# Patient Record
Sex: Male | Born: 1980 | Race: Black or African American | Hispanic: No | Marital: Single | Smoking: Never smoker
Health system: Southern US, Community
[De-identification: ages and names within clinical notes are randomized; demographics above are authoritative.]

## PROBLEM LIST (undated history)

## (undated) DIAGNOSIS — I10 Essential (primary) hypertension: Secondary | ICD-10-CM

## (undated) HISTORY — PX: KNEE SURGERY: SHX244

---

## 2009-09-19 DIAGNOSIS — B009 Herpesviral infection, unspecified: Secondary | ICD-10-CM | POA: Insufficient documentation

## 2009-09-19 DIAGNOSIS — B2790 Infectious mononucleosis, unspecified without complication: Secondary | ICD-10-CM | POA: Insufficient documentation

## 2010-01-02 ENCOUNTER — Encounter (INDEPENDENT_AMBULATORY_CARE_PROVIDER_SITE_OTHER): Payer: Self-pay | Admitting: *Deleted

## 2010-01-07 ENCOUNTER — Ambulatory Visit: Payer: Self-pay | Admitting: Infectious Diseases

## 2010-01-07 DIAGNOSIS — K120 Recurrent oral aphthae: Secondary | ICD-10-CM

## 2010-01-07 DIAGNOSIS — I10 Essential (primary) hypertension: Secondary | ICD-10-CM | POA: Insufficient documentation

## 2010-01-07 LAB — CONVERTED CEMR LAB
Basophils Absolute: 0 10*3/uL (ref 0.0–0.1)
Basophils Relative: 0 % (ref 0–1)
Lymphocytes Relative: 40 % (ref 12–46)
MCHC: 32.9 g/dL (ref 30.0–36.0)
Neutro Abs: 3.5 10*3/uL (ref 1.7–7.7)
Neutrophils Relative %: 49 % (ref 43–77)
RBC: 5.12 M/uL (ref 4.22–5.81)
RDW: 13.6 % (ref 11.5–15.5)

## 2010-09-29 NOTE — Miscellaneous (Signed)
Summary: HIPAA Restrictions  HIPAA Restrictions   Imported By: Florinda Marker 01/08/2010 11:33:01  _____________________________________________________________________  External Attachment:    Type:   Image     Comment:   External Document

## 2010-09-29 NOTE — Assessment & Plan Note (Signed)
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  History of Present Illness: 30 yo with obesity and HTN (? new dx) who was seen in [facility] Sep 27 2009 with fagtigue.  He says that at work in Jan he did a cbc on himself and noted that he had elevated lymphs and some atypical lymphs on manual smear he did.   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  He was very upset with this diagnosis His only other thing he remembers were canker sores in his mouth a few weeks ago.  He has no history of other stds or HIV.  He has no history of penile ulcers or rectal ulcers.  Has no abd pain or diarrhea.  He is sexually active with women but did go through a "bisexual phase".  He says he gets himself checked regularly. 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    Preventive Screening-Counseling & Management  Alcohol-Tobacco     Alcohol drinks/day: <1     Smoking Status: never  Caffeine-Diet-Exercise     Caffeine use/day: yes     Does Patient Exercise: yes     Type of exercise: walking, cardio     Exercise (avg: min/session): 1-2 hours     Times/week: 2   Updated Prior Medication List: VOLTAREN-XR 100 MG XR24H-TAB (DICLOFENAC SODIUM) Take 1 tablet by mouth once a day per PCP  Current Allergies (reviewed today): ! AMOXICILLIN ! AUGMENTIN ! * CHERRIES Past History:  Past Medical History: HSV II HTN Mono  Social History: works as a Optometrist at [employer]. Lives alone no tob,  occas alcohol no drugs  Review of Systems       11 systems reviewed and negative except per HPI   Vital Signs:  Patient profile:   30 year old male Height:      73.5 inches (186.69 cm) Weight:      400 pounds (181.82 kg) BMI:     52.25 Temp:     98.4 degrees F (36.89 degrees C) oral Pulse rate:   94 / minute BP sitting:   182 / 147  (left arm) Cuff size:   regular  Vitals Entered By: Jennet Maduro RN (Jan 07, 2010 3:11 PM) CC: previous infection with Mono and HSV II Is Patient Diabetic? No Pain Assessment Patient in pain? no      Nutritional Status BMI of > 30 = obese Nutritional Status Detail appetite "ok"  Have you ever been in a relationship where you felt threatened, hurt or afraid?NOT ASKED   Does patient need assistance? Functional Status Self care Ambulation Normal   Serial Vital Signs/Assessments:  Time      Position  BP       Pulse  Resp  Temp     By 3:14 PM   R Arm     190/114                        Jennet Maduro RN  Comments: 3:14 PM lower arm due to pt's size Jennet Maduro RN  Jan 07, 2010 3:14 PM  By: Jennet Maduro RN    Physical Exam  General:  obese  Head:  normocephalic.   Eyes:  vision grossly intact, pupils equal, pupils round, and pupils reactive to light.   Mouth:  good dentition.  no lesions  Neck:  supple.   Lungs:  normal respiratory effort and normal breath sounds.   Heart:  normal rate, regular rhythm, and no murmur.   Abdomen:  soft, non-tender, and normal bowel sounds.   Genitalia:  uncircumcised.  no lesions Msk:  normal ROM and no joint warmth.   Extremities:  no cce Neurologic:  alert & oriented X3 and cranial nerves II-XII intact.   Skin:  no rashes.   Cervical Nodes:  no anterior cervical adenopathy and no posterior cervical adenopathy.   Axillary Nodes:  no R axillary adenopathy and no L axillary adenopathy.   Inguinal Nodes:  some bil shoddy lan Psych:  Oriented X3 and memory intact for recent and remote.  nervious   Impression & Recommendations:  Problem # 1:  HSV (ICD-054.9)  Serological evidence of HSVII He has multiple questions re how he got this what it means and what to   Orders: Consultation Level IV (16109) T-CBC w/Diff (60454-09811) T-HIV Antibody  (Reflex) ([id number])  Problem # 2:  EPSTEIN-BARR VIRAL MONONUCLEOSIS (ICD-075) Resolved.  Orders: Consultation Level IV (13086) T-CBC  w/Diff ([id number]) T-HIV Antibody  (Reflex) ([id number])  Problem # 3:  ESSENTIAL HYPERTENSION (ICD-401.9)  He will follow up with Dr Renaee Munda at [facility].  He thinks it is so high today due to stress.    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  Reassured him and rec topical therapy if recurs.  Patient Instructions: 1)  Follow up as needed. 2)  Monitor for signs of herpes outbreak and call us if needed.  We can call invaltrex if needed. 3)  If you have further questions or concerns please make a follow up appointment. 4)  Please call for bloodwork results in few days.

## 2010-09-29 NOTE — Miscellaneous (Signed)
Summary: Problems, Medications and Allergies updated  Clinical Lists Changes  Problems: Added new problem of HSV (ICD-054.9) - HSV II positive Added new problem of EPSTEIN-BARR VIRAL MONONUCLEOSIS (ICD-075) - old/resolving Medications: Added new medication of VOLTAREN-XR 100 MG XR24H-TAB (DICLOFENAC SODIUM) Take 1 tablet by mouth once a day per PCP Allergies: Added new allergy or adverse reaction of AMOXICILLIN Observations: Added new observation of ALLERGY REV: Done (01/02/2010 11:12) Added new observation of NKA: F (01/02/2010 11:12)

## 2013-07-10 ENCOUNTER — Ambulatory Visit: Payer: BC Managed Care – HMO | Attending: Orthopedic Surgery | Admitting: Rehabilitation

## 2013-07-10 DIAGNOSIS — M25569 Pain in unspecified knee: Secondary | ICD-10-CM | POA: Insufficient documentation

## 2013-07-10 DIAGNOSIS — IMO0001 Reserved for inherently not codable concepts without codable children: Secondary | ICD-10-CM | POA: Insufficient documentation

## 2013-07-16 ENCOUNTER — Ambulatory Visit: Payer: BC Managed Care – HMO | Admitting: Rehabilitation

## 2013-07-23 ENCOUNTER — Encounter: Payer: BC Managed Care – HMO | Admitting: Rehabilitation

## 2013-07-30 ENCOUNTER — Ambulatory Visit: Payer: BC Managed Care – HMO | Admitting: Rehabilitation

## 2013-08-01 ENCOUNTER — Ambulatory Visit: Payer: BC Managed Care – HMO | Attending: Orthopedic Surgery | Admitting: Rehabilitation

## 2013-08-01 DIAGNOSIS — IMO0001 Reserved for inherently not codable concepts without codable children: Secondary | ICD-10-CM | POA: Insufficient documentation

## 2013-08-01 DIAGNOSIS — M25569 Pain in unspecified knee: Secondary | ICD-10-CM | POA: Insufficient documentation

## 2013-12-23 ENCOUNTER — Emergency Department (HOSPITAL_BASED_OUTPATIENT_CLINIC_OR_DEPARTMENT_OTHER)
Admission: EM | Admit: 2013-12-23 | Discharge: 2013-12-23 | Disposition: A | Discharge status: Home / Self Care | Payer: BC Managed Care – HMO | Attending: Emergency Medicine | Admitting: Emergency Medicine

## 2013-12-23 ENCOUNTER — Encounter (HOSPITAL_BASED_OUTPATIENT_CLINIC_OR_DEPARTMENT_OTHER): Payer: Self-pay | Admitting: Emergency Medicine

## 2013-12-23 ENCOUNTER — Emergency Department (HOSPITAL_BASED_OUTPATIENT_CLINIC_OR_DEPARTMENT_OTHER): Payer: BC Managed Care – HMO

## 2013-12-23 DIAGNOSIS — L02619 Cutaneous abscess of unspecified foot: Secondary | ICD-10-CM | POA: Insufficient documentation

## 2013-12-23 DIAGNOSIS — L03119 Cellulitis of unspecified part of limb: Secondary | ICD-10-CM

## 2013-12-23 DIAGNOSIS — L0390 Cellulitis, unspecified: Secondary | ICD-10-CM

## 2013-12-23 DIAGNOSIS — M19079 Primary osteoarthritis, unspecified ankle and foot: Secondary | ICD-10-CM | POA: Insufficient documentation

## 2013-12-23 DIAGNOSIS — I10 Essential (primary) hypertension: Secondary | ICD-10-CM | POA: Insufficient documentation

## 2013-12-23 DIAGNOSIS — Z79899 Other long term (current) drug therapy: Secondary | ICD-10-CM | POA: Insufficient documentation

## 2013-12-23 DIAGNOSIS — Z880 Allergy status to penicillin: Secondary | ICD-10-CM | POA: Insufficient documentation

## 2013-12-23 DIAGNOSIS — M1990 Unspecified osteoarthritis, unspecified site: Secondary | ICD-10-CM

## 2013-12-23 HISTORY — DX: Essential (primary) hypertension: I10

## 2013-12-23 LAB — CBC WITH DIFFERENTIAL/PLATELET
Basophils Absolute: 0 10*3/uL (ref 0.0–0.1)
Basophils Relative: 0 % (ref 0–1)
EOS ABS: 0 10*3/uL (ref 0.0–0.7)
EOS PCT: 0 % (ref 0–5)
HEMATOCRIT: 38.3 % — AB (ref 39.0–52.0)
HEMOGLOBIN: 12.8 g/dL — AB (ref 13.0–17.0)
LYMPHS ABS: 2.8 10*3/uL (ref 0.7–4.0)
LYMPHS PCT: 29 % (ref 12–46)
MCH: 27.2 pg (ref 26.0–34.0)
MCHC: 33.4 g/dL (ref 30.0–36.0)
MCV: 81.5 fL (ref 78.0–100.0)
MONO ABS: 0.9 10*3/uL (ref 0.1–1.0)
MONOS PCT: 9 % (ref 3–12)
Neutro Abs: 6 10*3/uL (ref 1.7–7.7)
Neutrophils Relative %: 62 % (ref 43–77)
Platelets: 373 10*3/uL (ref 150–400)
RBC: 4.7 MIL/uL (ref 4.22–5.81)
RDW: 14 % (ref 11.5–15.5)
WBC: 9.8 10*3/uL (ref 4.0–10.5)

## 2013-12-23 LAB — URIC ACID: Uric Acid, Serum: 10.6 mg/dL — ABNORMAL HIGH (ref 4.0–7.8)

## 2013-12-23 MED ORDER — NAPROXEN 375 MG PO TABS: 375.0000 mg | ORAL_TABLET | Freq: Two times a day (BID) | ORAL | Status: DC

## 2013-12-23 MED ORDER — CLINDAMYCIN HCL 300 MG PO CAPS: 300.0000 mg | ORAL_CAPSULE | Freq: Four times a day (QID) | ORAL | Status: DC

## 2013-12-23 MED ORDER — OXYCODONE-ACETAMINOPHEN 5-325 MG PO TABS
2.0000 | ORAL_TABLET | Freq: Once | ORAL | Status: AC
  Administered 2013-12-23: 2 via ORAL
  Filled 2013-12-23: qty 2

## 2013-12-23 MED ORDER — KETOROLAC TROMETHAMINE 60 MG/2ML IM SOLN
60.0000 mg | Freq: Once | INTRAMUSCULAR | Status: AC
  Administered 2013-12-23: 60 mg via INTRAMUSCULAR
  Filled 2013-12-23: qty 2

## 2013-12-23 MED ORDER — CLINDAMYCIN HCL 150 MG PO CAPS
300.0000 mg | ORAL_CAPSULE | Freq: Once | ORAL | Status: AC
  Administered 2013-12-23: 300 mg via ORAL
  Filled 2013-12-23: qty 2

## 2013-12-23 MED ORDER — OXYCODONE-ACETAMINOPHEN 5-325 MG PO TABS: 2.0000 | ORAL_TABLET | ORAL | Status: DC | PRN

## 2013-12-23 NOTE — Discharge Instructions (Signed)
Cellulitis Cellulitis is an infection of the skin and the tissue beneath it. The infected area is usually red and tender. Cellulitis occurs most often in the arms and lower legs.  CAUSES  Cellulitis is caused by bacteria that enter the skin through cracks or cuts in the skin. The most common types of bacteria that cause cellulitis are Staphylococcus and Streptococcus. SYMPTOMS   Redness and warmth.  Swelling.  Tenderness or pain.  Fever. DIAGNOSIS  Your caregiver can usually determine what is wrong based on a physical exam. Blood tests may also be done. TREATMENT  Treatment usually involves taking an antibiotic medicine. HOME CARE INSTRUCTIONS   Take your antibiotics as directed. Finish them even if you start to feel better.  Keep the infected arm or leg elevated to reduce swelling.  Apply a warm cloth to the affected area up to 4 times per day to relieve pain.  Only take over-the-counter or prescription medicines for pain, discomfort, or fever as directed by your caregiver.  Keep all follow-up appointments as directed by your caregiver. SEEK MEDICAL CARE IF:   You notice red streaks coming from the infected area.  Your red area gets larger or turns dark in color.  Your bone or joint underneath the infected area becomes painful after the skin has healed.  Your infection returns in the same area or another area.  You notice a swollen bump in the infected area.  You develop new symptoms. SEEK IMMEDIATE MEDICAL CARE IF:   You have a fever.  You feel very sleepy.  You develop vomiting or diarrhea.  You have a general ill feeling (malaise) with muscle aches and pains. MAKE SURE YOU:   Understand these instructions.  Will watch your condition.  Will get help right away if you are not doing well or get worse. Document Released: 05/26/2005 Document Revised: 02/15/2012 Document Reviewed: 11/01/2011 [facility] Patient Information 2014 [place], [place].  Arthritis,  Nonspecific Arthritis is inflammation of a joint. This usually means pain, redness, warmth or swelling are present. One or more joints may be involved. There are a number of types of arthritis. Your caregiver may not be able to tell what type of arthritis you have right away. CAUSES  The most common cause of arthritis is the wear and tear on the joint (osteoarthritis). This causes damage to the cartilage, which can break down over time. The knees, hips, back and neck are most often affected by this type of arthritis. Other types of arthritis and common causes of joint pain include:  Sprains and other injuries near the joint. Sometimes minor sprains and injuries cause pain and swelling that develop hours later.  Rheumatoid arthritis. This affects hands, feet and knees. It usually affects both sides of your body at the same time. It is often associated with chronic ailments, fever, weight loss and general weakness.  Crystal arthritis. Gout and pseudo gout can cause occasional acute severe pain, redness and swelling in the foot, ankle, or knee.  Infectious arthritis. Bacteria can get into a joint through a break in overlying skin. This can cause infection of the joint. Bacteria and viruses can also spread through the blood and affect your joints.  Drug, infectious and allergy reactions. Sometimes joints can become mildly painful and slightly swollen with these types of illnesses. SYMPTOMS   Pain is the main symptom.  Your joint or joints can also be red, swollen and warm or hot to the touch.  You may have a fever with certain types  of arthritis, or even feel overall ill.  The joint with arthritis will hurt with movement. Stiffness is present with some types of arthritis. DIAGNOSIS  Your caregiver will suspect arthritis based on your description of your symptoms and on your exam. Testing may be needed to find the type of arthritis:  Blood and sometimes urine tests.  X-ray tests and sometimes  CT or MRI scans.  Removal of fluid from the joint (arthrocentesis) is done to check for bacteria, crystals or other causes. Your caregiver (or a specialist) will numb the area over the joint with a local anesthetic, and use a needle to remove joint fluid for examination. This procedure is only minimally uncomfortable.  Even with these tests, your caregiver may not be able to tell what kind of arthritis you have. Consultation with a specialist (rheumatologist) may be helpful. TREATMENT  Your caregiver will discuss with you treatment specific to your type of arthritis. If the specific type cannot be determined, then the following general recommendations may apply. Treatment of severe joint pain includes:  Rest.  Elevation.  Anti-inflammatory medication (for example, ibuprofen) may be prescribed. Avoiding activities that cause increased pain.  Only take over-the-counter or prescription medicines for pain and discomfort as recommended by your caregiver.  Cold packs over an inflamed joint may be used for 10 to 15 minutes every hour. Hot packs sometimes feel better, but do not use overnight. Do not use hot packs if you are diabetic without your caregiver's permission.  A cortisone shot into arthritic joints may help reduce pain and swelling.  Any acute arthritis that gets worse over the next 1 to 2 days needs to be looked at to be sure there is no joint infection. Long-term arthritis treatment involves modifying activities and lifestyle to reduce joint stress jarring. This can include weight loss. Also, exercise is needed to nourish the joint cartilage and remove waste. This helps keep the muscles around the joint strong. HOME CARE INSTRUCTIONS   Do not take aspirin to relieve pain if gout is suspected. This elevates uric acid levels.  Only take over-the-counter or prescription medicines for pain, discomfort or fever as directed by your caregiver.  Rest the joint as much as possible.  If  your joint is swollen, keep it elevated.  Use crutches if the painful joint is in your leg.  Drinking plenty of fluids may help for certain types of arthritis.  Follow your caregiver's dietary instructions.  Try low-impact exercise such as:  Swimming.  Water aerobics.  Biking.  Walking.  Morning stiffness is often relieved by a warm shower.  Put your joints through regular range-of-motion. SEEK MEDICAL CARE IF:   You do not feel better in 24 hours or are getting worse.  You have side effects to medications, or are not getting better with treatment. SEEK IMMEDIATE MEDICAL CARE IF:   You have a fever.  You develop severe joint pain, swelling or redness.  Many joints are involved and become painful and swollen.  There is severe back pain and/or leg weakness.  You have loss of bowel or bladder control. Document Released: 09/23/2004 Document Revised: 11/08/2011 Document Reviewed: 10/09/2008 [facility] Patient Information 2014 [place]Care, [place]LL[place].

## 2013-12-23 NOTE — ED Notes (Signed)
Onset on Friday of right foot pain, swelling. No prior history of injury.

## 2013-12-23 NOTE — ED Provider Notes (Signed)
CSN: [id number]     Arrival date & time 12/23/13  1659 History  This chart was scribed for Timothy BuccoMelanie Adarian Bur, MD by Danella Maiersaroline Early, ED Scribe. This patient was seen in room MH07/MH07 and the patient's care was started at 5:20 PM.    Chief Complaint  Patient presents with  . Foot Pain   The history is provided by the patient. No language interpreter was used.   HPI Comments: Geradine GirtCarlos Rhatigan is a 33 y.o. male who presents to the Emergency Department complaining of gradually-worsening right foot pain with associated swelling  onset 2 days ago. No reported fevers at home.  Temp was 99 in the ED. The pain is concentrated in the right great toe and radiates in the foot. He states even the bedsheets hurt to touch. Redness to the area started yesterday. He reports pain with bearing weight on the foot. He states he wore a new pair of shoes 3 days ago. H/o fractures in the same foot in 2009. No h/o gout, but does have a FH of gout.  Has had an elevated uric acid level in the past.  He took leftover Mobic with no relief. He denies injury. He denies numbness or tingling. He is otherwise healthy.   Past Medical History  Diagnosis Date  . Hypertension    Past Surgical History  Procedure Laterality Date  . Knee surgery      right knee athroscopy with release   No family history on file. History  Substance Use Topics  . Smoking status: Never Smoker   . Smokeless tobacco: Not on file  . Alcohol Use: Yes    Review of Systems  Constitutional: Negative for fever, chills, diaphoresis and fatigue.  HENT: Negative for congestion, rhinorrhea and sneezing.   Eyes: Negative.   Respiratory: Negative for cough, chest tightness and shortness of breath.   Cardiovascular: Negative for chest pain and leg swelling.  Gastrointestinal: Negative for nausea, vomiting, abdominal pain, diarrhea and blood in stool.  Genitourinary: Negative for frequency, hematuria, flank pain and difficulty urinating.  Musculoskeletal:  Positive for arthralgias (right foot) and joint swelling (right foot). Negative for back pain.  Skin: Negative for rash.  Neurological: Negative for dizziness, speech difficulty, weakness, numbness and headaches.      Allergies  Amoxicillin; Amoxicillin-pot clavulanate; and Cherry  Home Medications   Prior to Admission medications   Medication Sig Start Date End Date Taking? Authorizing Provider  Diclofenac Sodium CR (VOLTAREN-XR) 100 MG 24 hr tablet Take 100 mg by mouth daily.      Historical Provider, MD   BP 168/108  Pulse 112  Temp(Src) 99.6 F (37.6 C)  Resp 20  Ht 6\' 1"  (1.854 m)  Wt 380 lb (172.367 kg)  BMI 50.15 kg/m2  SpO2 100% Physical Exam  Constitutional: He is oriented to person, place, and time. He appears well-developed and well-nourished.  HENT:  Head: Normocephalic and atraumatic.  Neck: Normal range of motion. Neck supple.  Cardiovascular: Normal rate.   Pulmonary/Chest: Effort normal.  Musculoskeletal: He exhibits edema and tenderness.  Mild swelling at the base of the right big toe.  It extends slightly towards the dorsum of the foot. There is some underlying redness to this area and TTP. NVI.  Neurological: He is alert and oriented to person, place, and time.  Skin: Skin is warm and dry.  Psychiatric: He has a normal mood and affect.    ED Course  Procedures (including critical care time) Medications  ketorolac (TORADOL) injection 60  mg (not administered)  oxyCODONE-acetaminophen (PERCOCET/ROXICET) 5-325 MG per tablet 2 tablet (2 tablets Oral Given 12/23/13 1819)  clindamycin (CLEOCIN) capsule 300 mg (300 mg Oral Given 12/23/13 1820)    DIAGNOSTIC STUDIES: Oxygen Saturation is 100% on RA, normal by my interpretation.    COORDINATION OF CARE: 5:44 PM- Discussed treatment plan with pt which includes foot x-ray and pain medication. Pt agrees to plan.  Results for orders placed during the hospital encounter of 12/23/13  CBC WITH DIFFERENTIAL       Result Value Ref Range   WBC 9.8  4.0 - 10.5 K/uL   RBC 4.70  4.22 - 5.81 MIL/uL   Hemoglobin 12.8 (*) 13.0 - 17.0 g/dL   HCT 16.1 (*) 09.6 - 04.5 %   MCV 81.5  78.0 - 100.0 fL   MCH 27.2  26.0 - 34.0 pg   MCHC 33.4  30.0 - 36.0 g/dL   RDW 40.9  81.1 - 91.4 %   Platelets 373  150 - 400 K/uL   Neutrophils Relative % 62  43 - 77 %   Neutro Abs 6.0  1.7 - 7.7 K/uL   Lymphocytes Relative 29  12 - 46 %   Lymphs Abs 2.8  0.7 - 4.0 K/uL   Monocytes Relative 9  3 - 12 %   Monocytes Absolute 0.9  0.1 - 1.0 K/uL   Eosinophils Relative 0  0 - 5 %   Eosinophils Absolute 0.0  0.0 - 0.7 K/uL   Basophils Relative 0  0 - 1 %   Basophils Absolute 0.0  0.0 - 0.1 K/uL  URIC ACID      Result Value Ref Range   Uric Acid, Serum 10.6 (*) 4.0 - 7.8 mg/dL   Dg Foot Complete Right  12/23/2013   CLINICAL DATA:  Right foot pain and swelling.  Low grade fever.  EXAM: RIGHT FOOT COMPLETE - 3+ VIEW  COMPARISON:  None.  FINDINGS: No acute bony or joint abnormality is identified. No radiopaque foreign body or soft tissue gas collection is seen. Dorsal calcaneal spurring is incidentally noted.  IMPRESSION: No acute abnormality.   Electronically Signed   By: Drusilla Kanner M.D.   On: 12/23/2013 18:47      EKG Interpretation None      MDM   Final diagnoses:  Arthritis  Cellulitis    Patient presents with pain, swelling and redness to the base of the right big toe. There some extension of the redness towards the dorsum of the foot.  There is no induration, fluctuance or wounds. I feel this likely is gout 6, however given her some redness extending into the dorsum of the foot, I will treat him as a cellulitis as well. I have a lower suspicion of septic arthritis. I don't feel any significant joint fluid collections. He was treated with Percocet as well as a shot of Toradol in the ED. He felt like the redness and the pain had subsided somewhat in the ED. He was also given clindamycin. He was discharged with a  prescription for clindamycin, Naprosyn, and Percocet. Advised him that he needs a wound check in 2 days. He feels he can followup with his primary care physician. I advised to return here if he has any worsening symptoms or extension of the redness. He can also have his blood pressure rechecked at his 2 day wound check.  I personally performed the services described in this documentation, which was scribed in my presence.  The  recorded information has been reviewed and considered.   Timothy BuccoMelanie Cornelis Kluver, MD 12/23/13 [phone]

## 2015-09-22 IMAGING — CR DG FOOT COMPLETE 3+V*R*
3 series · 3 of 3 positions shown · non-contrast
Comparison: None.

CLINICAL DATA: Right foot pain and swelling.  Low grade fever.

EXAM:
RIGHT FOOT COMPLETE - 3+ VIEW

[t foot ap right]
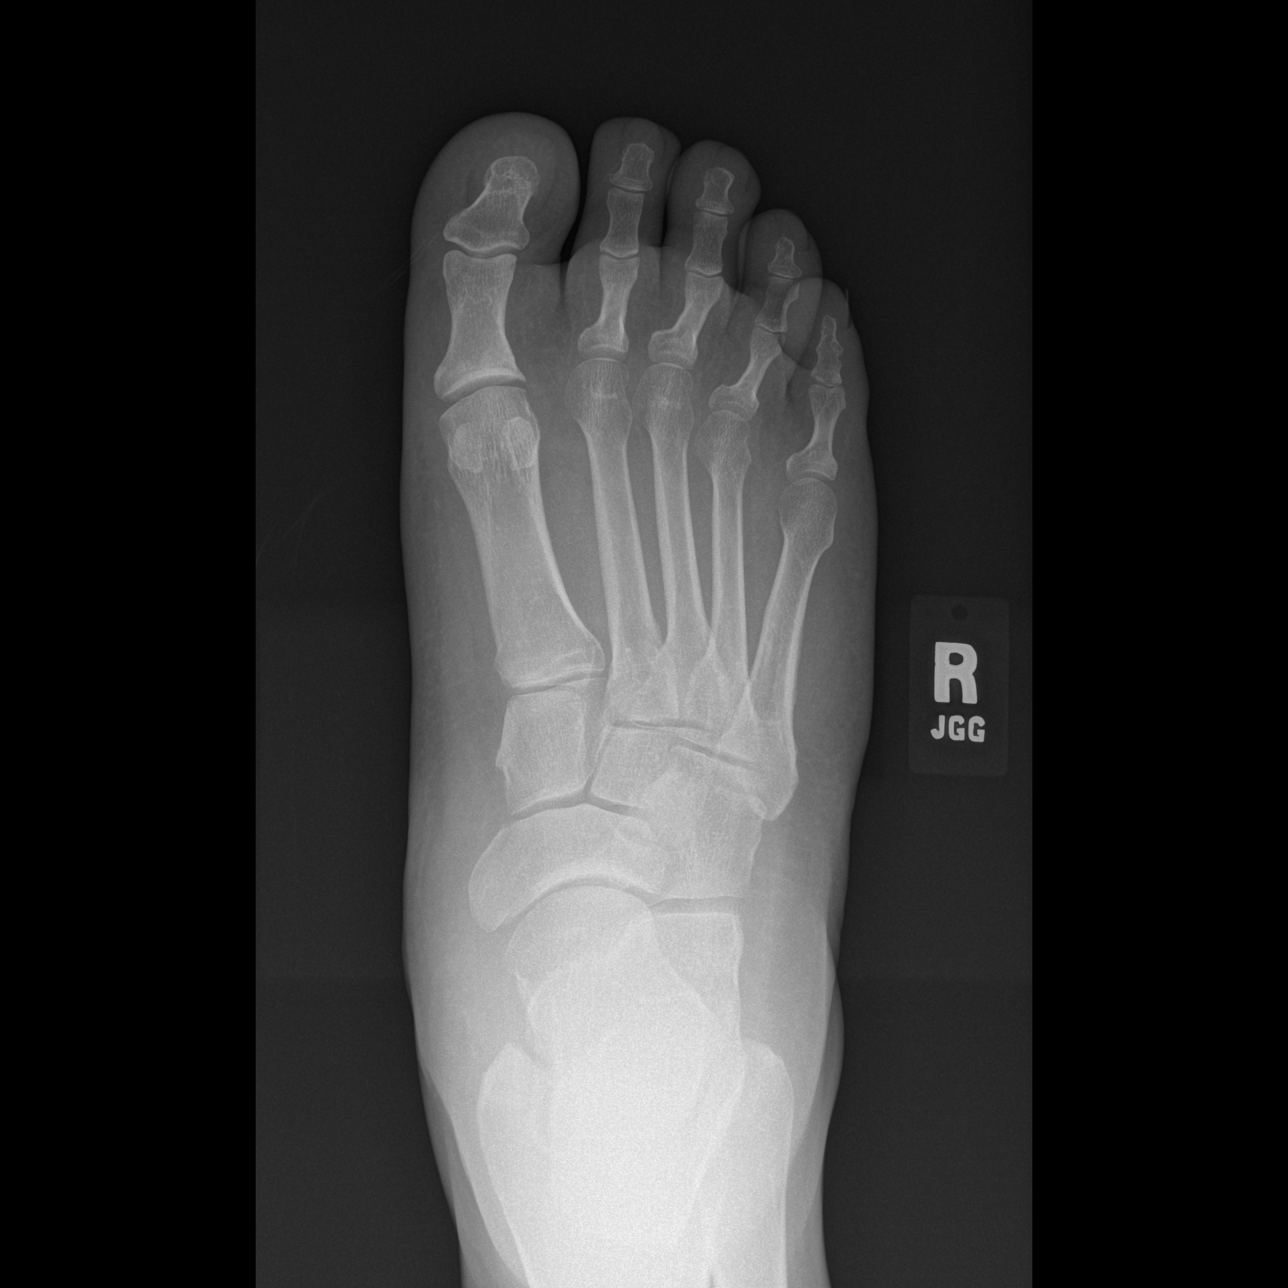

[t foot oblique right]
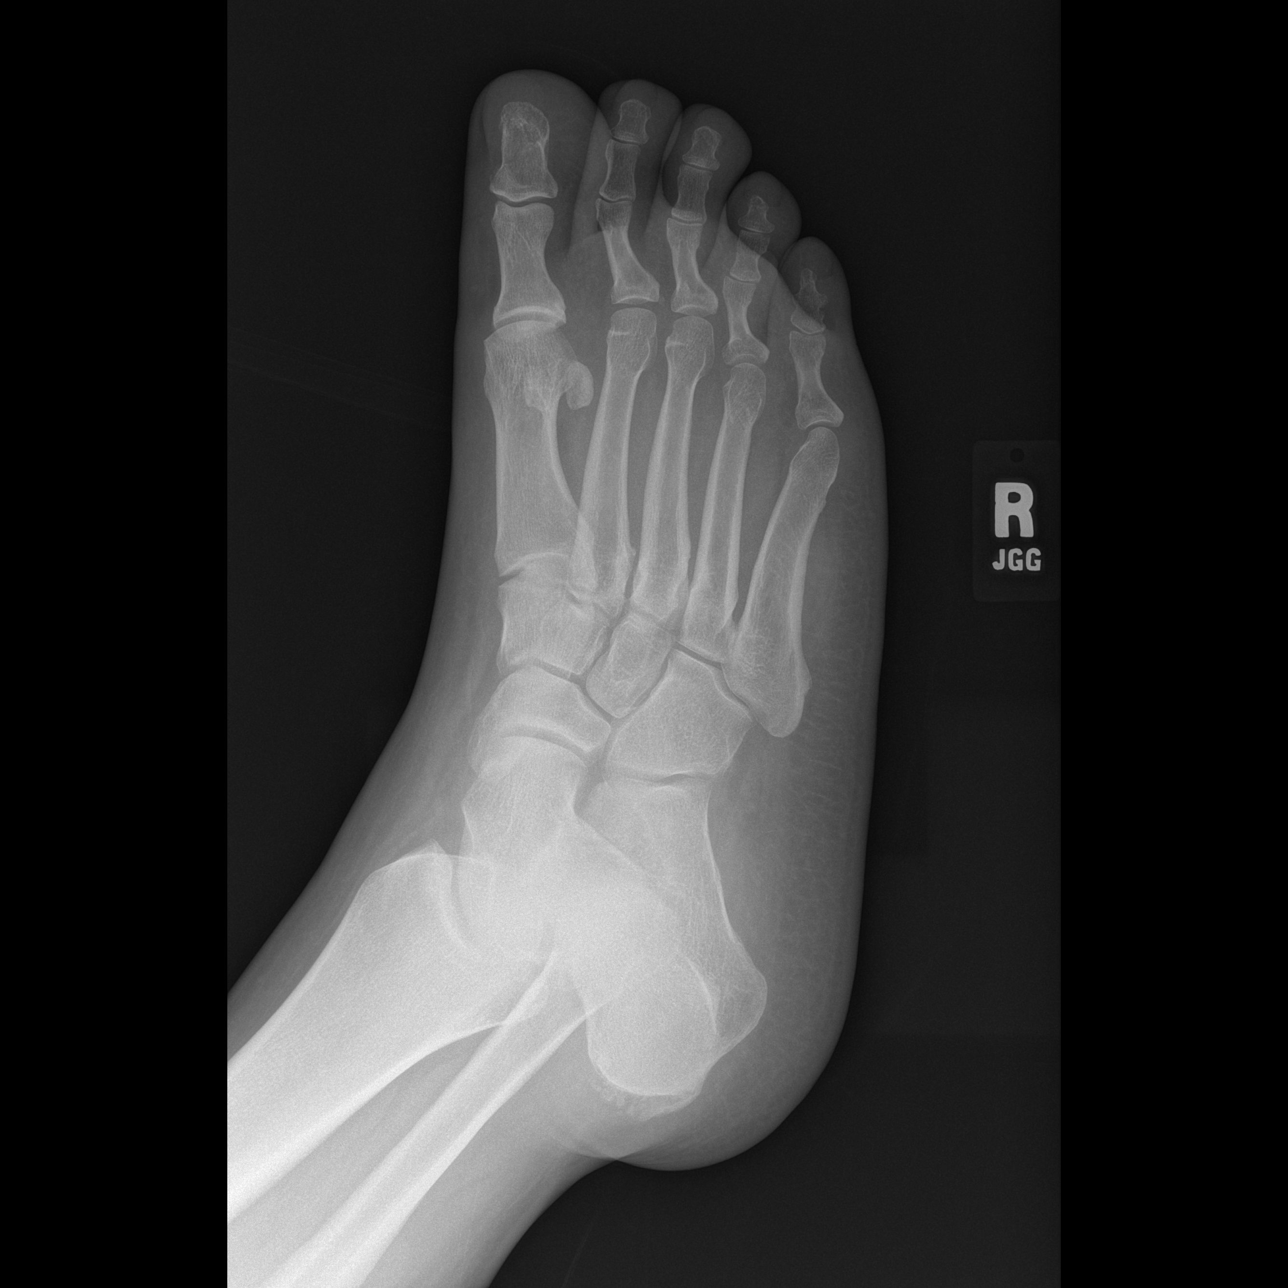

[t foot lat right]
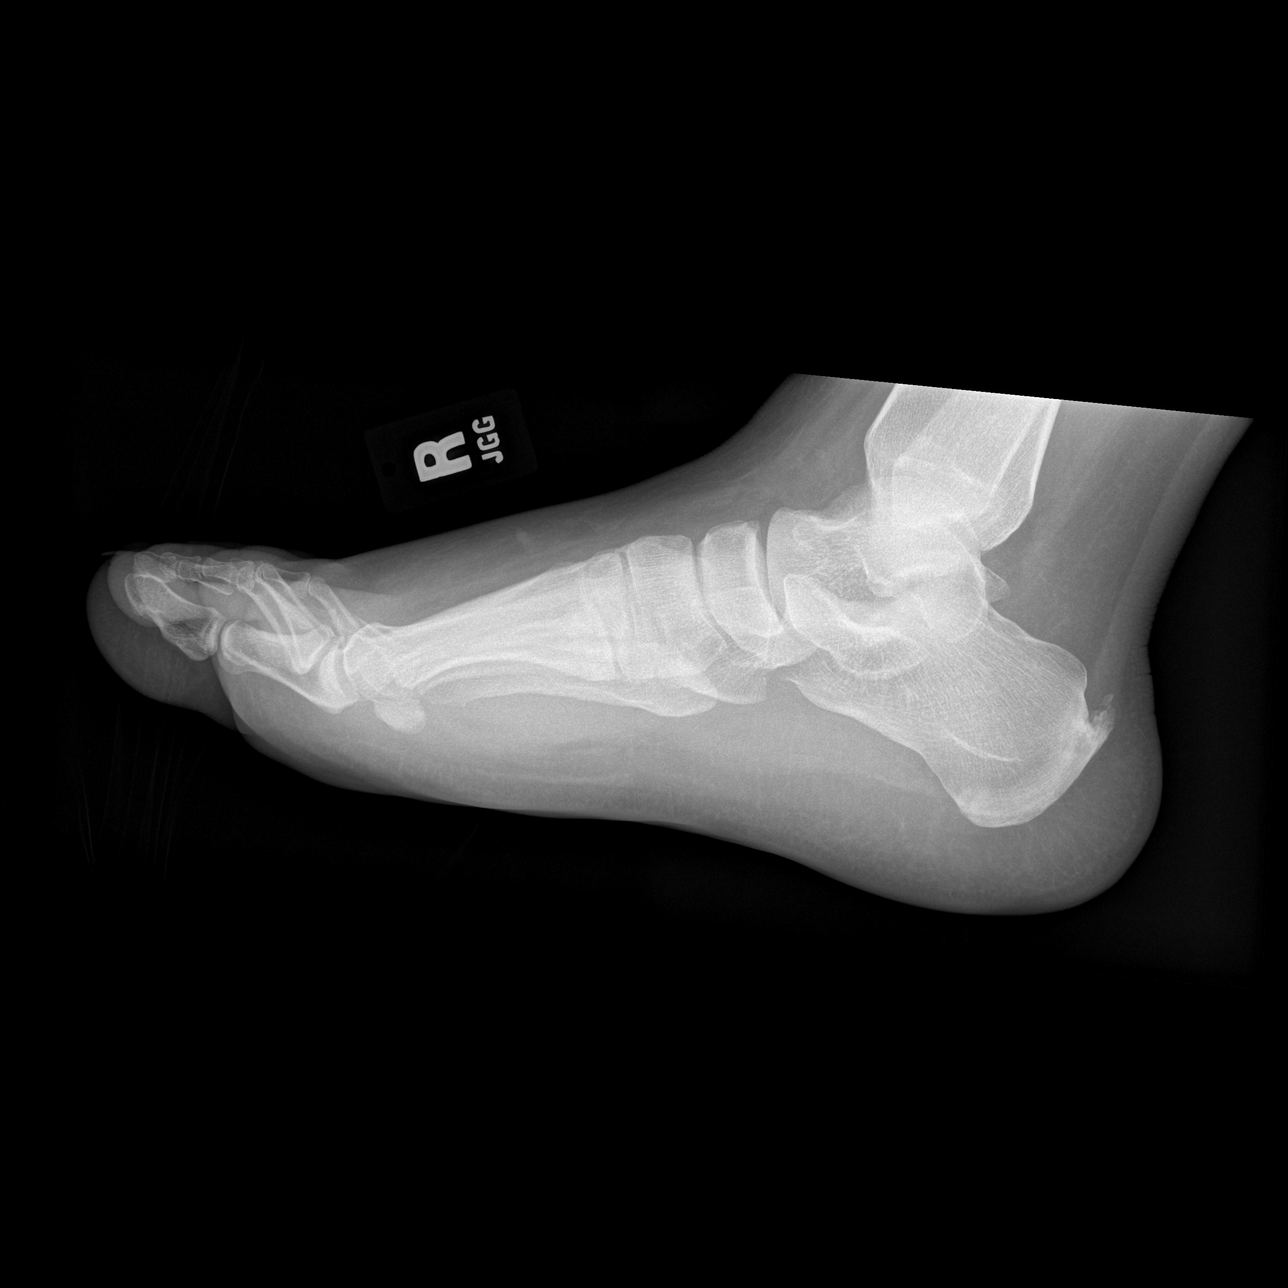

[3 of 3 positions shown; findings below may reference images not displayed]

FINDINGS: No acute bony or joint abnormality is identified. No radiopaque
foreign body or soft tissue gas collection is seen. Dorsal calcaneal
spurring is incidentally noted.
IMPRESSION: No acute abnormality.

## 2021-07-17 ENCOUNTER — Ambulatory Visit: Payer: PRIVATE HEALTH INSURANCE | Attending: Family Medicine

## 2021-07-17 ENCOUNTER — Other Ambulatory Visit: Payer: Self-pay

## 2021-07-17 DIAGNOSIS — M546 Pain in thoracic spine: Secondary | ICD-10-CM | POA: Diagnosis present

## 2021-07-17 DIAGNOSIS — M6281 Muscle weakness (generalized): Secondary | ICD-10-CM | POA: Diagnosis present

## 2021-07-17 DIAGNOSIS — S29012A Strain of muscle and tendon of back wall of thorax, initial encounter: Secondary | ICD-10-CM | POA: Insufficient documentation

## 2021-07-19 NOTE — Therapy (Signed)
[facility] Outpatient Rehabilitation [facility] [address] [place], [place], [address] Phone: [phone]   Fax:  [phone]  Physical Therapy Evaluation  Patient Details  Name: Timothy Bell MRN: [id number] Date of Birth: 20-Sep-1980 Referring Provider (PT): Lanell Persons, MD   Encounter Date: 07/17/2021   PT End of Session - 07/19/21 2103     Visit Number 1    Number of Visits 13    Date for PT Re-Evaluation 09/06/21    Authorization Type [employer]    Progress Note Due on Visit 10    PT Start Time 0720    PT Stop Time 0805    PT Time Calculation (min) 45 min    Activity Tolerance Patient tolerated treatment well    Behavior During Therapy [facility] for tasks assessed/performed             Past Medical History:  Diagnosis Date   Hypertension     Past Surgical History:  Procedure Laterality Date   KNEE SURGERY     right knee athroscopy with release    There were no vitals filed for this visit.    Subjective Assessment - 07/19/21 2251     Subjective Pt reports lifting and reaching out with a container of deionized water, appox the size of a water tank (5 gallons) on Jun 16, 2021, and pulling a muscle on his mid back. He feels like it is getting wrose. Initially he tried ibuprofen, but stopped due to BP concerns. He is now taking tylenol. Currently he is on light duty, completing activities in primarily in sitting.    How long can you sit comfortably? 1 hour    How long can you stand comfortably? 30 mins    How long can you walk comfortably? Not a significant issue    Diagnostic tests None    Patient Stated Goals For pain to resolve and return to his normal duties at work.    Currently in Pain? Yes   5-8 pain range   Pain Score 6     Pain Location Back    Pain Orientation Right;Mid    Pain Descriptors / Indicators Aching    Pain Type Acute pain    Pain Onset 1 to 4 weeks ago    Pain Frequency Constant    Aggravating Factors  Sleeping  on the R, reaching/use of the R arm, driving    Pain Relieving Factors Stretching back or arm, tylenol    Effect of Pain on Daily Activities Manging activity                [facility] PT Assessment - 07/19/21 0001       Assessment   Medical Diagnosis Strain of muscle and tendon of back wall of thorax, initial    Referring Provider (PT) Lanell Persons, MD    Hand Dominance Right    Next MD Visit 2 weeks    Prior Therapy Not issue      Precautions   Precautions None      Restrictions   Weight Bearing Restrictions No      Balance Screen   Has the patient fallen in the past 6 months No      Home Environment   Additional Comments No issues with accessing or mobility within home      Prior Function   Level of Independence Independent    Vocation Full time employment    Vocation Requirements [employer]- Walking, bending, stooping, reaching, lifting  Cognition   Overall Cognitive Status Within Functional Limits for tasks assessed      Observation/Other Assessments   Focus on Therapeutic Outcomes (FOTO)  NA      Sensation   Light Touch Appears Intact      Posture/Postural Control   Posture/Postural Control Postural limitations    Postural Limitations Forward head;Rounded Shoulders;Increased thoracic kyphosis      ROM / Strength   AROM / PROM / Strength AROM;Strength      AROM   Overall AROM Comments Trunk ROM are WNLs c pain provked with forward flexion. R shoulde ROMs are WNLs with min. pain provoked with R UE elevation      Strength   Overall Strength Comments R shoulder demonstrated 4= strengthc flexion, abd and ER due to pain. All other r shoulder movements did not provoke pain      Palpation   Palpation comment TTP to the R paraspinals and interscapular muscles from T4-T10 with the most tender area at T7                        Objective measurements completed on examination: See above findings.     [facility] Adult PT  Treatment/Exercise:  Therapeutic Exercise: - R cross arm c trunk rotation L stretch, 2x 20" - R arm reach through while standing leaning forward on counter, 2x20" - shoulder row, RTB, 10x            PT Education - 07/19/21 2103     Education Details Eval findings, POC, HEP, sleeping positions and support for comfort, heat/cold/biofreeze for pain management    Person(s) Educated Patient    Methods Explanation;Demonstration;Tactile cues;Verbal cues    Comprehension Verbalized understanding;Returned demonstration;Verbal cues required;Tactile cues required              PT Short Term Goals - 07/19/21 2218       PT SHORT TERM GOAL #1   Title Pt will be Ind in an initial HEP    Baseline started on eval    Target Date 08/09/21      PT SHORT TERM GOAL #2   Title Pt will voice understanding of measures to assist in the reduction and management of pain.    Status New    Target Date 08/09/21               PT Long Term Goals - 07/19/21 2241       PT LONG TERM GOAL #1   Title Pt will be Ind in a final HEP to maintain achieved LOF    Status New    Target Date 09/05/21      PT LONG TERM GOAL #2   Title Pt will demonstrate 5/5 strength of the R UE and tolerate lfiting 40Lbs from knee to chest height with 1 lift    Baseline see flowsheet    Target Date 09/05/21      PT LONG TERM GOAL #3   Title Pt will tolerate lifting 1lb 20x from waist level to shoulder height demonstrating appropriate tolerance for repeated lifting    Status New    Target Date 09/05/21      PT LONG TERM GOAL #4   Title Pt will reports improved mid back pain to 0-2 with daily and work related activities    Baseline 5-8/10    Status New    Target Date 09/05/21  Plan - 07/19/21 2201     Clinical Impression Statement Pt presents with signs and symptoms consistent with a mid back stain of the R interscapular muscles and throacic paraspinals. Limitations are  primarily related to reaching with the R UE, resisted R UE activities, ie.lifting, and prolonged postures and activities. PT was initiated for genlte stretches and strengthenig to this area. Pt will benefit from skilled PT 2w6 to address pain and musculoskeletal deficits to optimize functional use of the R UE.    Examination-Activity Limitations Reach Overhead;Carry;Lift;Stand    Examination-Participation Restrictions Occupation    Stability/Clinical Decision Making Stable/Uncomplicated    Clinical Decision Making Low    Rehab Potential Good    PT Frequency 2x / week    PT Duration 6 weeks    PT Treatment/Interventions ADLs/Self Care Home Management;Cryotherapy;Electrical Stimulation;Iontophoresis 4mg /ml Dexamethasone;Moist Heat;Neuromuscular re-education;Therapeutic exercise;Therapeutic activities;Patient/family education;Manual techniques;Dry needling;Passive range of motion;Taping;Vasopneumatic Device;Joint Manipulations    PT Next Visit Plan Assess response to HEP, progress ther ex as indicated    PT Home Exercise Plan R cross arm c trunk rotation L stretch, R arm reach through while standing leaning forward on counter, shoulder row             Patient will benefit from skilled therapeutic intervention in order to improve the following deficits and impairments:  Decreased activity tolerance, Obesity, Impaired UE functional use, Pain, Decreased strength  Visit Diagnosis: Strain of muscle and tendon of back wall of thorax, initial encounter  Pain in thoracic spine  Muscle weakness (generalized)     Problem List Patient Active Problem List   Diagnosis Date Noted   ESSENTIAL HYPERTENSION 01/07/2010   ORAL APHTHAE 01/07/2010   HSV 09/19/2009   EPSTEIN-BARR VIRAL MONONUCLEOSIS 09/19/2009    09/21/2009, PT 07/19/2021, 10:58 PM  [facility] Outpatient Rehabilitation [facility] [address] [place], [place], [place] Phone: [phone]   Fax:   [phone]  Name: Timothy Bell MRN: Geradine Girt Date of Birth: 1981/02/15

## 2021-07-28 ENCOUNTER — Emergency Department
Admission: RE | Admit: 2021-07-28 | Discharge: 2021-07-28 | Disposition: A | Discharge status: Home / Self Care | Payer: 59 | Source: Ambulatory Visit

## 2021-07-28 ENCOUNTER — Other Ambulatory Visit: Payer: Self-pay

## 2021-07-28 ENCOUNTER — Ambulatory Visit: Payer: PRIVATE HEALTH INSURANCE

## 2021-07-28 VITALS — BP 92/68 | HR 105 | Temp 98.3°F | Resp 14

## 2021-07-28 DIAGNOSIS — R059 Cough, unspecified: Secondary | ICD-10-CM

## 2021-07-28 MED ORDER — DOXYCYCLINE HYCLATE 100 MG PO CAPS: 100.0000 mg | ORAL_CAPSULE | Freq: Two times a day (BID) | ORAL | 0 refills | Status: AC

## 2021-07-28 MED ORDER — BENZONATATE 200 MG PO CAPS
200.0000 mg | ORAL_CAPSULE | Freq: Three times a day (TID) | ORAL | 0 refills | Status: AC | PRN
Start: 2021-07-28 — End: 2021-08-04

## 2021-07-28 MED ORDER — PREDNISONE 20 MG PO TABS: ORAL_TABLET | ORAL | 0 refills | Status: DC

## 2021-07-28 NOTE — Discharge Instructions (Addendum)
Advised patient to take medication as directed with food to completion.  Advised patient may take Prednisone burst with first dose of Doxycycline for 5 of next 7 days.  Encouraged patient increase daily water intake while taking this medication.  Advised patient may take Tessalon Perles daily or as needed for cough.

## 2021-07-28 NOTE — ED Triage Notes (Signed)
Pt presents with cough that began Thursday that has not improved with OTC medication. Pt states that it feels the same as when he had pneumonia. Neg covid test x2

## 2021-07-28 NOTE — ED Provider Notes (Signed)
[facility]    CSN: [id number] Arrival date & time: 07/28/21  1231      History   Chief Complaint Chief Complaint  Patient presents with   Appt 1PM   Cough    HPI Timothy Bell is a 40 y.o. male.   HPI 40 year old male presents with cough for 5 days which has not improved with OTC medication.  Patient reports that he feels the same as he did when he once had pneumonia.  Patient reports 2 negative home COVID-19 test.  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 Authorizing Provider  benzonatate (TESSALON) 200 MG capsule Take 1 capsule (200 mg total) by mouth 3 (three) times daily as needed for up to 7 days for cough. 07/28/21 08/04/21 Yes Trevor Iha, FNP  doxycycline (VIBRAMYCIN) 100 MG capsule Take 1 capsule (100 mg total) by mouth 2 (two) times daily for 7 days. 07/28/21 08/04/21 Yes Trevor Iha, FNP  predniSONE (DELTASONE) 20 MG tablet Take 3 tabs PO daily x 5 days. 07/28/21  Yes Trevor Iha, FNP  allopurinol (ZYLOPRIM) 100 MG tablet Take 100 mg by mouth daily.    [provider]  chlorthalidone (HYGROTON) 25 MG tablet Take 25 mg by mouth daily.    [provider]  clindamycin (CLEOCIN) 300 MG capsule Take 1 capsule (300 mg total) by mouth 4 (four) times daily. X 7 days Patient not taking: Reported on 07/19/2021 12/23/13   Rolan Bucco, MD  Diclofenac Sodium CR 100 MG 24 hr tablet Take 100 mg by mouth daily.   Patient not taking: Reported on 07/19/2021    [provider]  naproxen (NAPROSYN) 375 MG tablet Take 1 tablet  (375 mg total) by mouth 2 (two) times daily. Patient not taking: Reported on 07/19/2021 12/23/13   Rolan Bucco, MD  olmesartan (BENICAR) 40 MG tablet Take 40 mg by mouth daily.    [provider]  oxyCODONE-acetaminophen (PERCOCET) 5-325 MG per tablet Take 2 tablets by mouth every 4 (four) hours as needed. Patient not taking: Reported on 07/19/2021 12/23/13   Rolan Bucco, MD    Family History History reviewed. No pertinent family history.  Social History Social History   Tobacco Use   Smoking status: Never  Substance Use Topics   Alcohol use: Yes   Drug use: No     Allergies   Amoxicillin, Amoxicillin-pot clavulanate, and Cherry   Review of Systems Review of Systems  Respiratory:  Positive for cough.   All other systems reviewed and are negative.   Physical Exam Triage Vital Signs ED Triage Vitals  Enc Vitals Group     BP 07/28/21 1327 92/68     Pulse Rate 07/28/21 1327 (!) 105     Resp 07/28/21 1327 14     Temp 07/28/21 1327 98.3 F (36.8 C)     Temp Source 07/28/21 1327 Oral     SpO2 07/28/21 1327 100 %     Weight --      Height --  Head Circumference --      Peak Flow --      Pain Score 07/28/21 1329 0     Pain Loc --      Pain Edu? --      Excl. in GC? --    No data found.  Updated Vital Signs BP 92/68 (BP Location: Left Arm)   Pulse (!) 105   Temp 98.3 F (36.8 C) (Oral)   Resp 14   SpO2 100%    Physical Exam Vitals reviewed.  Constitutional:      General: He is not in acute distress.    Appearance: Normal appearance. He is obese. He is not ill-appearing.  HENT:     Head: Normocephalic and atraumatic.     Right Ear: Tympanic membrane, ear canal and external ear normal.     Left Ear: Tympanic membrane, ear canal and external ear normal.     Mouth/Throat:     Mouth: Mucous membranes are moist.     Pharynx: Oropharynx is clear.  Eyes:     Extraocular Movements: Extraocular movements intact.     Conjunctiva/sclera:  Conjunctivae normal.     Pupils: Pupils are equal, round, and reactive to light.  Cardiovascular:     Rate and Rhythm: Normal rate and regular rhythm.     Pulses: Normal pulses.     Heart sounds: Normal heart sounds.  Pulmonary:     Effort: Pulmonary effort is normal.     Breath sounds: Normal breath sounds. No wheezing, rhonchi or rales.     Comments: Infrequent nonproductive cough noted on exam Musculoskeletal:        General: Normal range of motion.     Cervical back: Normal range of motion and neck supple.  Skin:    General: Skin is warm and dry.  Neurological:     Mental Status: He is alert.     UC Treatments / Results  Labs (all labs ordered are listed, but only abnormal results are displayed) Labs Reviewed - No data to display  EKG   Radiology No results found.  Procedures Procedures (including critical care time)  Medications Ordered in UC Medications - No data to display  Initial Impression / Assessment and Plan / UC Course  I have reviewed the triage vital signs and the nursing notes.  Pertinent labs & imaging results that were available during my care of the patient were reviewed by me and considered in my medical decision making (see chart for details).     MDM: 1.  Cough-Rx'd Doxycycline, Prednisone burst, and Tessalon Perles. Advised patient to take medication as directed with food to completion.  Advised patient may take Prednisone burst with first dose of Doxycycline for 5 of next 7 days.  Encouraged patient increase daily water intake while taking this medication.  Advised patient may take Tessalon Perles daily or as needed for cough.  Work note provided prior to discharge per patient request.  Patient discharged home, hemodynamically stable. Final Clinical Impressions(s) / UC Diagnoses   Final diagnoses:  Cough, unspecified type     Discharge Instructions      Advised patient to take medication as directed with food to completion.  Advised  patient may take Prednisone burst with first dose of Doxycycline for 5 of next 7 days.  Encouraged patient increase daily water intake while taking this medication.  Advised patient may take Tessalon Perles daily or as needed for cough.       ED Prescriptions  Medication Sig Dispense Auth. Provider   doxycycline (VIBRAMYCIN) 100 MG capsule Take 1 capsule (100 mg total) by mouth 2 (two) times daily for 7 days. 14 capsule Trevor Iha, FNP   predniSONE (DELTASONE) 20 MG tablet Take 3 tabs PO daily x 5 days. 15 tablet Trevor Iha, FNP   benzonatate (TESSALON) 200 MG capsule Take 1 capsule (200 mg total) by mouth 3 (three) times daily as needed for up to 7 days for cough. 40 capsule Trevor Iha, FNP      PDMP not reviewed this encounter.   Trevor Iha, FNP 07/28/21 1512

## 2021-07-30 ENCOUNTER — Ambulatory Visit: Payer: 59 | Admitting: Physical Therapy

## 2021-08-13 ENCOUNTER — Other Ambulatory Visit: Payer: Self-pay

## 2021-08-13 ENCOUNTER — Ambulatory Visit: Payer: PRIVATE HEALTH INSURANCE | Attending: Family Medicine

## 2021-08-13 DIAGNOSIS — S29012A Strain of muscle and tendon of back wall of thorax, initial encounter: Secondary | ICD-10-CM | POA: Diagnosis present

## 2021-08-13 DIAGNOSIS — M546 Pain in thoracic spine: Secondary | ICD-10-CM | POA: Insufficient documentation

## 2021-08-13 DIAGNOSIS — M6281 Muscle weakness (generalized): Secondary | ICD-10-CM | POA: Insufficient documentation

## 2021-08-13 NOTE — Therapy (Signed)
[facility] Outpatient Rehabilitation [facility] [address] [place], [place], [address] Phone: [phone]   Fax:  [phone]  Physical Therapy Treatment  Patient Details  Name: Demere Dotzler MRN: [id number] Date of Birth: 08-11-1981 Referring Provider (PT): Lanell Persons, MD   Encounter Date: 08/13/2021   PT End of Session - 08/13/21 0809     Visit Number 2    Number of Visits 13    Date for PT Re-Evaluation 09/06/21    Authorization Type [facility] [facility]    Progress Note Due on Visit 10    PT Start Time 0806    PT Stop Time 0845    PT Time Calculation (min) 39 min    Activity Tolerance Patient tolerated treatment well    Behavior During Therapy [facility] for tasks assessed/performed             Past Medical History:  Diagnosis Date   Hypertension     Past Surgical History:  Procedure Laterality Date   KNEE SURGERY     right knee athroscopy with release    There were no vitals filed for this visit.   Subjective Assessment - 08/13/21 0814     Subjective Pt reports his R shoulder blade/mid back pain is improving. Still on light duty. Pt notes his pain is improving without taking pain medication.    Patient Stated Goals For pain to resolve and return to his normal duties at work.    Currently in Pain? No/denies    Pain Score --   0-3/10   Pain Location Back    Pain Orientation Right;Mid    Pain Descriptors / Indicators Aching    Pain Type Acute pain    Pain Onset 1 to 4 weeks ago    Pain Frequency Constant             [facility] Adult PT Treatment/Exercise:   Therapeutic Exercise: - R cross arm c trunk rotation L stretch, 2x 20" - R arm reach through while standing leaning forward on counter, 2x20" - shoulder row, RTB, 10x3 - shoulder ext, RTB, 10x3 -serratus  press/punch, RTB, 10x3   Manual Therapy:   Neuromuscular re-ed:   Therapeutic Activity:     blank = not completed                      PT  Education - 08/13/21 0843     Education Details Upadted HEP    Person(s) Educated Patient    Methods Explanation;Demonstration;Tactile cues;Verbal cues;Handout    Comprehension Verbalized understanding;Returned demonstration;Verbal cues required;Tactile cues required              PT Short Term Goals - 07/19/21 2218       PT SHORT TERM GOAL #1   Title Pt will be Ind in an initial HEP    Baseline started on eval    Target Date 08/09/21      PT SHORT TERM GOAL #2   Title Pt will voice understanding of measures to assist in the reduction and management of pain.    Status New    Target Date 08/09/21               PT Long Term Goals - 07/19/21 2241       PT LONG TERM GOAL #1   Title Pt will be Ind in a final HEP to maintain achieved LOF    Status New    Target Date 09/05/21      PT  LONG TERM GOAL #2   Title Pt will demonstrate 5/5 strength of the R UE and tolerate lfiting 40Lbs from knee to chest height with 1 lift    Baseline see flowsheet    Target Date 09/05/21      PT LONG TERM GOAL #3   Title Pt will tolerate lifting 1lb 20x from waist level to shoulder height demonstrating appropriate tolerance for repeated lifting    Status New    Target Date 09/05/21      PT LONG TERM GOAL #4   Title Pt will reports improved mid back pain to 0-2 with daily and work related activities    Baseline 5-8/10    Status New    Target Date 09/05/21                   Plan - 08/13/21 0844     Clinical Impression Statement PT returns to PT after the IE. Pt reports he has been completing his HEP and his R shoulder blade and mid back pain is improving. PT was completed for stretching and strengthening to the periscapular and mid back muscle with progress in demand. Pt tolerated PT today without increase in pain above baseline. Pt making apprpropriate progress. Pt will benefit from continued skilled PT to optimize function with decreased pain.    Examination-Activity  Limitations Reach Overhead;Carry;Lift;Stand    Examination-Participation Restrictions Occupation    Stability/Clinical Decision Making Stable/Uncomplicated    Clinical Decision Making Low    Rehab Potential Good    PT Frequency 2x / week    PT Duration 6 weeks    PT Treatment/Interventions ADLs/Self Care Home Management;Cryotherapy;Electrical Stimulation;Iontophoresis 4mg /ml Dexamethasone;Moist Heat;Neuromuscular re-education;Therapeutic exercise;Therapeutic activities;Patient/family education;Manual techniques;Dry needling;Passive range of motion;Taping;Vasopneumatic Device;Joint Manipulations    PT Next Visit Plan Assess response to HEP, progress ther ex as indicated. Increase resistance and complete shoulder height to OH lifting as tolerated.    PT Home Exercise Plan R cross arm c trunk rotation L stretch, R arm reach through while standing leaning forward on counter, shoulder row             Patient will benefit from skilled therapeutic intervention in order to improve the following deficits and impairments:  Decreased activity tolerance, Obesity, Impaired UE functional use, Pain, Decreased strength  Visit Diagnosis: Strain of muscle and tendon of back wall of thorax, initial encounter  Pain in thoracic spine  Muscle weakness (generalized)     Problem List Patient Active Problem List   Diagnosis Date Noted   ESSENTIAL HYPERTENSION 01/07/2010   ORAL APHTHAE 01/07/2010   HSV 09/19/2009   EPSTEIN-BARR VIRAL MONONUCLEOSIS 09/19/2009   09/21/2009 MS, PT 08/14/21 5:37 AM   [facility] Outpatient Rehabilitation [facility] [address] [place], [place], [place] Phone: [phone]   Fax:  [phone]  Name: Jerrik Housholder MRN: Geradine Girt Date of Birth: 01-Jan-1981

## 2021-08-18 ENCOUNTER — Other Ambulatory Visit: Payer: Self-pay

## 2021-08-18 ENCOUNTER — Ambulatory Visit: Payer: PRIVATE HEALTH INSURANCE | Admitting: Physical Therapy

## 2021-08-18 ENCOUNTER — Encounter: Payer: Self-pay | Admitting: Physical Therapy

## 2021-08-18 DIAGNOSIS — M6281 Muscle weakness (generalized): Secondary | ICD-10-CM

## 2021-08-18 DIAGNOSIS — S29012A Strain of muscle and tendon of back wall of thorax, initial encounter: Secondary | ICD-10-CM | POA: Diagnosis not present

## 2021-08-18 DIAGNOSIS — M546 Pain in thoracic spine: Secondary | ICD-10-CM

## 2021-08-18 NOTE — Therapy (Signed)
[facility] Rehabilitation [facility] [address] [place], [place], [address] Phone: [phone]   Fax:  [phone]  Physical Therapy Treatment  Patient Details  Name: Timothy Bell MRN: [id number] Date of Birth: 11/25/1980 Referring Provider (PT): Lanell Persons, MD   Encounter Date: 08/18/2021   PT End of Session - 08/18/21 0723     Visit Number 3    Number of Visits 13    Date for PT Re-Evaluation 09/06/21    Authorization Type [facility]    Progress Note Due on Visit 10    PT Start Time 0717    PT Stop Time 0755    PT Time Calculation (min) 38 min             Past Medical History:  Diagnosis Date   Hypertension     Past Surgical History:  Procedure Laterality Date   KNEE SURGERY     right knee athroscopy with release    There were no vitals filed for this visit.   Subjective Assessment - 08/18/21 0720     Subjective Went to Dr yesterday. He said I can start trying additional activities at work as tolerated, but still light duty I guess technically. I do not feel any pain.    Currently in Pain? No/denies            [facility] Adult PT Treatment/Exercise:   Therapeutic Exercise:  - shoulder row, BTB, 10x3 - shoulder ext, BTB, 10x3 - bilat scap retract / ER with green band  - cabinet reaching 1# x 10, 2 # x 10 , 5# x 10  -OH press 5# x10, 10# x 10 - R cross arm c trunk rotation L stretch, 2x 20" - R arm reach through while standing leaning forward on counter, 2x20"  Not performed today -serratus  press/punch, BTB, 10x3   Manual Therapy:   Neuromuscular re-ed:   Therapeutic Activity:       PT Short Term Goals - 07/19/21 2218       PT SHORT TERM GOAL #1   Title Pt will be Ind in an initial HEP    Baseline started on eval    Target Date 08/09/21      PT SHORT TERM GOAL #2   Title Pt will voice understanding of measures to assist in the reduction and management of pain.    Status New    Target  Date 08/09/21               PT Long Term Goals - 07/19/21 2241       PT LONG TERM GOAL #1   Title Pt will be Ind in a final HEP to maintain achieved LOF    Status New    Target Date 09/05/21      PT LONG TERM GOAL #2   Title Pt will demonstrate 5/5 strength of the R UE and tolerate lfiting 40Lbs from knee to chest height with 1 lift    Baseline see flowsheet    Target Date 09/05/21      PT LONG TERM GOAL #3   Title Pt will tolerate lifting 1lb 20x from waist level to shoulder height demonstrating appropriate tolerance for repeated lifting    Status New    Target Date 09/05/21      PT LONG TERM GOAL #4   Title Pt will reports improved mid back pain to 0-2 with daily and work related activities    Baseline 5-8/10    Status New  Target Date 09/05/21                   Plan - 08/18/21 0839     Clinical Impression Statement Pt reports no pain on arrival and compliance with HEP. Progressed scap stab and began lifting with ability to lift 10# overhead multiple reps without increased pain. Updated HEP with stronger therabands. Pt reported feeling good and no pain at end of session.    PT Treatment/Interventions ADLs/Self Care Home Management;Cryotherapy;Electrical Stimulation;Iontophoresis 4mg /ml Dexamethasone;Moist Heat;Neuromuscular re-education;Therapeutic exercise;Therapeutic activities;Patient/family education;Manual techniques;Dry needling;Passive range of motion;Taping;Vasopneumatic Device;Joint Manipulations    PT Next Visit Plan Assess response to HEP, progress ther ex as indicated. Increase resistance and complete shoulder height to OH lifting as tolerated.    PT Home Exercise Plan R cross arm c trunk rotation L stretch, R arm reach through while standing leaning forward on counter, shoulder row             Patient will benefit from skilled therapeutic intervention in order to improve the following deficits and impairments:  Decreased activity tolerance,  Obesity, Impaired UE functional use, Pain, Decreased strength  Visit Diagnosis: Strain of muscle and tendon of back wall of thorax, initial encounter  Pain in thoracic spine  Muscle weakness (generalized)     Problem List Patient Active Problem List   Diagnosis Date Noted   ESSENTIAL HYPERTENSION 01/07/2010   ORAL APHTHAE 01/07/2010   HSV 09/19/2009   EPSTEIN-BARR VIRAL MONONUCLEOSIS 09/19/2009    09/21/2009, PTA 08/18/2021, 8:43 AM  [facility] [address] [place], [place], [place] Phone: [phone]   Fax:  [phone]  Name: Timothy Bell MRN: Geradine Girt Date of Birth: [birth year]

## 2021-08-20 ENCOUNTER — Ambulatory Visit: Payer: PRIVATE HEALTH INSURANCE

## 2021-08-20 ENCOUNTER — Other Ambulatory Visit: Payer: Self-pay

## 2021-08-20 DIAGNOSIS — M546 Pain in thoracic spine: Secondary | ICD-10-CM

## 2021-08-20 DIAGNOSIS — M6281 Muscle weakness (generalized): Secondary | ICD-10-CM

## 2021-08-20 DIAGNOSIS — S29012A Strain of muscle and tendon of back wall of thorax, initial encounter: Secondary | ICD-10-CM | POA: Diagnosis not present

## 2021-08-20 NOTE — Therapy (Signed)
[facility] Outpatient Rehabilitation [facility] [address] [place], [place], [address] Phone: [phone]   Fax:  [phone]  Physical Therapy Treatment  Patient Details  Name: Timothy Bell MRN: [id number] Date of Birth: 1980-11-24 Referring Provider (PT): Lanell Persons, MD   Encounter Date: 08/20/2021   PT End of Session - 08/20/21 0718     Visit Number 4    Number of Visits 13    Date for PT Re-Evaluation 09/06/21    Authorization Type [facility]    Progress Note Due on Visit 10    PT Start Time 0719    PT Stop Time 0800    PT Time Calculation (min) 41 min    Activity Tolerance Patient tolerated treatment well    Behavior During Therapy [facility] for tasks assessed/performed             Past Medical History:  Diagnosis Date   Hypertension     Past Surgical History:  Procedure Laterality Date   KNEE SURGERY     right knee athroscopy with release    There were no vitals filed for this visit.                   TREATMENT 08/20/2021:  Therapeutic Exercise:  - UBE 4 mins L1 2 minsin each direction - shoulder row, BTB, x15 - shoulder ext, BTB, x15 - bilat scap retract / ER with green band 2x10 - cabinet reaching shoulder height 5#,10# x10 each, OH 5#  10# x10 - squat lifts c box, handle 10" c 90d turn to waist height 25# - R cross arm c trunk rotation L stretch, 2x 20" - R arm reach through while standing leaning forward on counter, 2x20"    Manual Therapy:     Neuromuscular re-ed:     Therapeutic Activity:  Self care: Pt Ed for proper lifting single arm to [facility] and for squat lifts c turning             PT Short Term Goals - 07/19/21 2218       PT SHORT TERM GOAL #1   Title Pt will be Ind in an initial HEP    Baseline started on eval    Target Date 08/09/21      PT SHORT TERM GOAL #2   Title Pt will voice understanding of measures to assist in the reduction and management of pain.    Status New     Target Date 08/09/21               PT Long Term Goals - 07/19/21 2241       PT LONG TERM GOAL #1   Title Pt will be Ind in a final HEP to maintain achieved LOF    Status New    Target Date 09/05/21      PT LONG TERM GOAL #2   Title Pt will demonstrate 5/5 strength of the R UE and tolerate lfiting 40Lbs from knee to chest height with 1 lift    Baseline see flowsheet    Target Date 09/05/21      PT LONG TERM GOAL #3   Title Pt will tolerate lifting 1lb 20x from waist level to shoulder height demonstrating appropriate tolerance for repeated lifting    Status New    Target Date 09/05/21      PT LONG TERM GOAL #4   Title Pt will reports improved mid back pain to 0-2 with daily and work related activities  Baseline 5-8/10    Status New    Target Date 09/05/21                    Patient will benefit from skilled therapeutic intervention in order to improve the following deficits and impairments:     Visit Diagnosis: Strain of muscle and tendon of back wall of thorax, initial encounter  Pain in thoracic spine  Muscle weakness (generalized)     Problem List Patient Active Problem List   Diagnosis Date Noted   ESSENTIAL HYPERTENSION 01/07/2010   ORAL APHTHAE 01/07/2010   HSV 09/19/2009   EPSTEIN-BARR VIRAL MONONUCLEOSIS 09/19/2009    Joellyn Rued MS, PT 08/20/21 8:03 AM   [facility] Rehabilitation [facility] [address] [place], [place], [address] Phone: [phone]   Fax:  [phone]  Name: Timothy Bell MRN: [id number] Date of Birth: 1981-05-20

## 2021-08-27 ENCOUNTER — Ambulatory Visit: Payer: PRIVATE HEALTH INSURANCE | Admitting: Physical Therapy

## 2021-08-27 ENCOUNTER — Other Ambulatory Visit: Payer: Self-pay

## 2021-08-27 ENCOUNTER — Encounter: Payer: Self-pay | Admitting: Physical Therapy

## 2021-08-27 DIAGNOSIS — M6281 Muscle weakness (generalized): Secondary | ICD-10-CM

## 2021-08-27 DIAGNOSIS — M546 Pain in thoracic spine: Secondary | ICD-10-CM

## 2021-08-27 DIAGNOSIS — S29012A Strain of muscle and tendon of back wall of thorax, initial encounter: Secondary | ICD-10-CM

## 2021-08-27 NOTE — Therapy (Signed)
Timothy Bell, [place], [address] Phone: [phone]   Fax:  [phone]  Physical Therapy Treatment  Patient Details  Name: Timothy Bell MRN: [id number] Date of Birth: 1980/09/01 Referring Provider (PT): Odis Luster, MD   Encounter Date: 08/27/2021   PT End of Session - 08/27/21 0728     Visit Number 5    Number of Visits 13    Date for PT Re-Evaluation 09/06/21    Authorization Type [facility]    PT Start Time 0721    PT Stop Time 0759    PT Time Calculation (min) 38 min             Past Medical History:  Diagnosis Date   Hypertension     Past Surgical History:  Procedure Laterality Date   KNEE SURGERY     right knee athroscopy with release    There were no vitals filed for this visit.   Subjective Assessment - 08/27/21 0725     Subjective Pt reports he has not had any pain in the shoulder at all since last visit.    Currently in Pain? No/denies                 TREATMENT 08/20/2021:  Therapeutic Exercise:  - UBE 4 mins L1 2 minsin each direction - shoulder row, BTB, x15 - shoulder ext, BTB, x15 - bilat scap retract / ER with green band 2x10 -protraction blue band x 20 -bicep curl 5# to [facility] press x 10   - cabinet reaching shoulder  OH 10# x10 - squat lifts c box, handle 10" c 90d turn to waist height 30#, 40#, 40# box also from floor- dead lift vs squat x 5 each  - R cross arm c trunk rotation L stretch, 2x 20" - R arm reach through while standing leaning forward on counter, 2x20"    Manual Therapy:     Neuromuscular re-ed:     Therapeutic Activity:  Self care:           PT Short Term Goals - 08/27/21 0727       PT SHORT TERM GOAL #1   Title Pt will be Ind in an initial HEP    Baseline started on eval; 08/27/21 independent with HEP    Status Achieved      PT SHORT TERM GOAL #2   Title Pt will voice understanding of measures to assist  in the reduction and management of pain.    Status Achieved    Target Date 08/09/21               PT Long Term Goals - 08/27/21 1004       PT LONG TERM GOAL #1   Title Pt will be Ind in a final HEP to maintain achieved LOF    Status Achieved    Target Date 09/05/21      PT LONG TERM GOAL #2   Title Pt will demonstrate 5/5 strength of the R UE and tolerate lfiting 40Lbs from knee to chest height with 1 lift    Baseline 08/27/21 able to lift knee to chest height 40#    Period Weeks    Status Partially Met    Target Date 09/05/21      PT LONG TERM GOAL #3   Title Pt will tolerate lifting 1lb 20x from waist level to shoulder height demonstrating appropriate tolerance for repeated lifting    Period Weeks  Status On-going    Target Date 09/05/21      PT LONG TERM GOAL #4   Title Pt will reports improved mid back pain to 0-2 with daily and work related activities    Baseline 5-8/10  08/27/21: no pain for the last week.    Status Partially Met                   Plan - 08/27/21 0956     Clinical Impression Statement Pt tolerated progression of lifting to 40# from floor and was educated on lifting via squat or dead lift. He was able to return demonstrate with mod cues. he continues to be compliant with HEP. He has met all STGS and LTG #1. Will continue to progress as tolerated.    PT Treatment/Interventions ADLs/Self Care Home Management;Cryotherapy;Electrical Stimulation;Iontophoresis 51m/ml Dexamethasone;Moist Heat;Neuromuscular re-education;Therapeutic exercise;Therapeutic activities;Patient/family education;Manual techniques;Dry needling;Passive range of motion;Taping;Vasopneumatic Device;Joint Manipulations    PT Next Visit Plan Assess response to HEP, progress ther ex as indicated. Increase resistance and complete shoulder height to OH lifting as tolerated. progress lifting demand as tolerated.    PT Home Exercise Plan R cross arm c trunk rotation L stretch, R arm  reach through while standing leaning forward on counter, shoulder row             Patient will benefit from skilled therapeutic intervention in order to improve the following deficits and impairments:  Decreased activity tolerance, Obesity, Impaired UE functional use, Pain, Decreased strength  Visit Diagnosis: Strain of muscle and tendon of back wall of thorax, initial encounter  Pain in thoracic spine  Muscle weakness (generalized)     Problem List Patient Active Problem List   Diagnosis Date Noted   ESSENTIAL HYPERTENSION 01/07/2010   ORAL APHTHAE 01/07/2010   HSV 09/19/2009   EPSTEIN-BARR VIRAL MONONUCLEOSIS 09/19/2009    DDorene Ar PTA 08/27/2021, 10:08 AM  [facility] [address]Phone: [phone]  Fax:  [phone] Name: Timothy ThomleyMRN: [id number] of Birth: [birth year]

## 2021-08-28 ENCOUNTER — Ambulatory Visit: Payer: PRIVATE HEALTH INSURANCE

## 2021-09-02 ENCOUNTER — Encounter: Payer: Self-pay | Admitting: Physical Therapy

## 2021-09-02 ENCOUNTER — Ambulatory Visit: Payer: PRIVATE HEALTH INSURANCE | Attending: Family Medicine | Admitting: Physical Therapy

## 2021-09-02 ENCOUNTER — Other Ambulatory Visit: Payer: Self-pay

## 2021-09-02 DIAGNOSIS — M546 Pain in thoracic spine: Secondary | ICD-10-CM | POA: Diagnosis present

## 2021-09-02 DIAGNOSIS — M6281 Muscle weakness (generalized): Secondary | ICD-10-CM | POA: Diagnosis present

## 2021-09-02 DIAGNOSIS — S29012A Strain of muscle and tendon of back wall of thorax, initial encounter: Secondary | ICD-10-CM | POA: Diagnosis present

## 2021-09-02 NOTE — Therapy (Signed)
[place], [place], [address] Phone: [phone]   Fax:  [phone]  Physical Therapy Treatment  Patient Details  Name: Timothy Bell MRN: [id number] Date of Birth: 1981/07/09 Referring Provider (PT): Odis Luster, MD   Encounter Date: 09/02/2021   PT End of Session - 09/02/21 0722     Visit Number 6    Number of Visits 13    Date for PT Re-Evaluation 09/06/21    Authorization Type [facility]    PT Start Time 0720    PT Stop Time 0758    PT Time Calculation (min) 38 min             Past Medical History:  Diagnosis Date   Hypertension     Past Surgical History:  Procedure Laterality Date   KNEE SURGERY     right knee athroscopy with release    There were no vitals filed for this visit.   Subjective Assessment - 09/02/21 0721     Subjective The shoulder is feeling fine this morning. No pain to report. Did well after last session.    Currently in Pain? No/denies            TREATMENT 08/20/2021:  Therapeutic Exercise:  - UBE 4 mins L1 2 minsin each direction - shoulder row, Free motion 20# , x20 - shoulder ext, Free motion 20#, x20 - bilat scap retract / ER with Blue  band 2x10 -OMEGA Chest press 20# x 20, 30# x 15  - right cabinet reaching -curl to [facility] cabinet reach 10# 10 x 2  - standing blue band forward raise x 10 lateral raise x 10  - R cross arm c trunk rotation L stretch, 2x 20" - R arm reach through while standing leaning forward on counter, 2x20"    Manual Therapy:     Neuromuscular re-ed:     Therapeutic Activity:  Self care:    [facility] PT Assessment - 09/02/21 0001       Strength   Overall Strength Comments right shoulder 4+/5 flex, 4+/5 abduct, ER 5/5   no pain                PT Short Term Goals - 08/27/21 0727       PT SHORT TERM GOAL #1   Title Pt will be Ind in an initial HEP    Baseline started on eval; 08/27/21 independent  with HEP    Status Achieved      PT SHORT TERM GOAL #2   Title Pt will voice understanding of measures to assist in the reduction and management of pain.    Status Achieved    Target Date 08/09/21               PT Long Term Goals - 09/02/21 0723       PT LONG TERM GOAL #1   Title Pt will be Ind in a final HEP to maintain achieved LOF    Status Achieved    Target Date 09/05/21      PT LONG TERM GOAL #2   Title Pt will demonstrate 5/5 strength of the R UE and tolerate lfiting 40Lbs from knee to chest height with 1 lift    Baseline 08/27/21 able to lift knee to chest height 40#; 09/02/20: MMT 4+/5 for fleion and abduction    Period Weeks    Status Partially Met    Target Date 09/05/21      PT  LONG TERM GOAL #3   Title Pt will tolerate lifting 10 lb 20x from waist level to shoulder height demonstrating appropriate tolerance for repeated lifting    Period Weeks    Status Achieved    Target Date 09/05/21      PT LONG TERM GOAL #4   Title Pt will reports improved mid back pain to 0-2 with daily and work related activities    Baseline 5-8/10  08/27/21: no pain for the last week, 09/02/21: continues without pain for last 2 weeks.    Period Weeks    Status Achieved    Target Date 09/05/21                   Plan - 09/02/21 0806     Clinical Impression Statement Pt reports no pain over the last 2 weeks. He is compliant with HEP. His MMT has improved to 4+/5 for shoulder flexion and abduction. Progressed wtih blue band forward and lateral raises and updated HEP. Progressed to gym machines for scap and shoulder strength. He tolerated the progressions well with fatigue, no pain. He has met or partially met all LTGs.    PT Treatment/Interventions ADLs/Self Care Home Management;Cryotherapy;Electrical Stimulation;Iontophoresis 58m/ml Dexamethasone;Moist Heat;Neuromuscular re-education;Therapeutic exercise;Therapeutic activities;Patient/family education;Manual techniques;Dry  needling;Passive range of motion;Taping;Vasopneumatic Device;Joint Manipulations    PT Next Visit Plan Assess response to HEP, progress ther ex as indicated. Increase resistance and complete shoulder height to OH lifting as tolerated. progress lifting demand as tolerated.    PT Home Exercise Plan R cross arm c trunk rotation L stretch, R arm reach through while standing leaning forward on counter, shoulder row    Consulted and Agree with Plan of Care Patient             Patient will benefit from skilled therapeutic intervention in order to improve the following deficits and impairments:  Decreased activity tolerance, Obesity, Impaired UE functional use, Pain, Decreased strength  Visit Diagnosis: Strain of muscle and tendon of back wall of thorax, initial encounter  Pain in thoracic spine  Muscle weakness (generalized)     Problem List Patient Active Problem List   Diagnosis Date Noted   ESSENTIAL HYPERTENSION 01/07/2010   ORAL APHTHAE 01/07/2010   HSV 09/19/2009   EPSTEIN-BARR VIRAL MONONUCLEOSIS 09/19/2009    DDorene Ar PTA 09/02/2021, 8:13 AM  [facility]GB[place] [address]Phone: [phone]  Fax:  [phone] Name: Timothy CockerellMRN: [id number] of Birth: 5Nov 27, 1982

## 2021-09-03 ENCOUNTER — Ambulatory Visit: Payer: PRIVATE HEALTH INSURANCE | Attending: Family Medicine

## 2021-09-03 DIAGNOSIS — S29012A Strain of muscle and tendon of back wall of thorax, initial encounter: Secondary | ICD-10-CM | POA: Insufficient documentation

## 2021-09-03 DIAGNOSIS — M546 Pain in thoracic spine: Secondary | ICD-10-CM | POA: Diagnosis present

## 2021-09-03 DIAGNOSIS — M6281 Muscle weakness (generalized): Secondary | ICD-10-CM | POA: Insufficient documentation

## 2021-09-03 NOTE — Therapy (Signed)
[place], [place], [address] Phone: [phone]   Fax:  [phone]  Physical Therapy Treatment  Patient Details  Name: Timothy Bell MRN: [id number] Date of Birth: Sep 21, 1980 Referring Provider (PT): Odis Luster, MD   Encounter Date: 09/03/2021   PT End of Session - 09/03/21 0827     Visit Number 7    Number of Visits 13    Date for PT Re-Evaluation 09/06/21    Authorization Type [facility]    Progress Note Due on Visit 10    PT Start Time 0820    PT Stop Time 0845    PT Time Calculation (min) 25 min    Activity Tolerance Patient tolerated treatment well    Behavior During Therapy [facility] for tasks assessed/performed             Past Medical History:  Diagnosis Date   Hypertension     Past Surgical History:  Procedure Laterality Date   KNEE SURGERY     right knee athroscopy with release    There were no vitals filed for this visit.   Subjective Assessment - 09/03/21 0825     Subjective Pt continues to report no L shoulder or mid back pain.    Patient Stated Goals For pain to resolve and return to his normal duties at work.    Currently in Pain? No/denies                            TREATMENT 08/20/2021:   Therapeutic Exercise:   - UBE 5 mins L3 2.5 mins in each direction - R cross arm c trunk rotation L stretch, 2x 20" - R arm reach through while standing leaning forward on counter, 2x20" - Diagonal shoulder press c trunk rotation, high to low and low to high, freemotion, 10#, 10x, L and R - Chest/serratus press, freemotion, 2x10  Manual Therapy:     Neuromuscular re-ed:     Therapeutic Activity:   Self care:  Not completed this seesion 09/03/21: - shoulder row, Free motion 20# , x20 - shoulder ext, Free motion 20#, x20 - bilat scap retract / ER with Blue  band 2x10 -OMEGA Chest press 20# x 20, 30# x 15  - right cabinet reaching -curl to [facility]  cabinet reach 10# 10 x 2  - standing blue band forward raise x 10 lateral raise x 10                PT Short Term Goals - 08/27/21 0727       PT SHORT TERM GOAL #1   Title Pt will be Ind in an initial HEP    Baseline started on eval; 08/27/21 independent with HEP    Status Achieved      PT SHORT TERM GOAL #2   Title Pt will voice understanding of measures to assist in the reduction and management of pain.    Status Achieved    Target Date 08/09/21               PT Long Term Goals - 09/02/21 0723       PT LONG TERM GOAL #1   Title Pt will be Ind in a final HEP to maintain achieved LOF    Status Achieved    Target Date 09/05/21      PT LONG TERM GOAL #2   Title Pt will demonstrate 5/5 strength of the R  UE and tolerate lfiting 40Lbs from knee to chest height with 1 lift    Baseline 08/27/21 able to lift knee to chest height 40#; 09/02/20: MMT 4+/5 for fleion and abduction    Period Weeks    Status Partially Met    Target Date 09/05/21      PT LONG TERM GOAL #3   Title Pt will tolerate lifting 10 lb 20x from waist level to shoulder height demonstrating appropriate tolerance for repeated lifting    Period Weeks    Status Achieved    Target Date 09/05/21      PT LONG TERM GOAL #4   Title Pt will reports improved mid back pain to 0-2 with daily and work related activities    Baseline 5-8/10  08/27/21: no pain for the last week, 09/02/21: continues without pain for last 2 weeks.    Period Weeks    Status Achieved    Target Date 09/05/21                   Plan - 09/03/21 0843     Clinical Impression Statement Pt was late for today's appt with pt noting traffic issues. For the treatment time available, PT focused trunk ad UB strengthening with diagonal patterns for complex movements incorporating stabization and movement. Pt tolerated the session without provocation of R post. shoulder/mid back pain. Pt will continue to benefit from skilled PT in  preparation to RTW at full duty.    Examination-Activity Limitations Reach Overhead;Carry;Lift;Stand    Examination-Participation Restrictions Occupation    Stability/Clinical Decision Making Stable/Uncomplicated    Clinical Decision Making Low    Rehab Potential Good    PT Frequency 2x / week    PT Duration 6 weeks    PT Treatment/Interventions ADLs/Self Care Home Management;Cryotherapy;Electrical Stimulation;Iontophoresis 76m/ml Dexamethasone;Moist Heat;Neuromuscular re-education;Therapeutic exercise;Therapeutic activities;Patient/family education;Manual techniques;Dry needling;Passive range of motion;Taping;Vasopneumatic Device;Joint Manipulations    PT Next Visit Plan Assess response to HEP, progress ther ex as indicated. Increase resistance and complete shoulder height to OH lifting as tolerated. progress lifting demand as tolerated.    PT Home Exercise Plan R cross arm c trunk rotation L stretch, R arm reach through while standing leaning forward on counter, shoulder row    Consulted and Agree with Plan of Care Patient             Patient will benefit from skilled therapeutic intervention in order to improve the following deficits and impairments:  Decreased activity tolerance, Obesity, Impaired UE functional use, Pain, Decreased strength  Visit Diagnosis: Strain of muscle and tendon of back wall of thorax, initial encounter  Pain in thoracic spine  Muscle weakness (generalized)     Problem List Patient Active Problem List   Diagnosis Date Noted   ESSENTIAL HYPERTENSION 01/07/2010   ORAL APHTHAE 01/07/2010   HSV 09/19/2009   EPSTEIN-BARR VIRAL MONONUCLEOSIS 09/19/2009    AGar PontoMS, PT 09/03/21 10:04 PM   CClaremontCM[facility] LLC[address] E[address]GJ[place] [address]Phone: [phone]  Fax:  [phone] Name: Timothy LyMRN: [id number] of Birth: [birth year]

## 2021-09-04 ENCOUNTER — Ambulatory Visit: Payer: PRIVATE HEALTH INSURANCE | Admitting: Physical Therapy

## 2021-09-10 ENCOUNTER — Other Ambulatory Visit: Payer: Self-pay

## 2021-09-10 ENCOUNTER — Ambulatory Visit: Payer: PRIVATE HEALTH INSURANCE

## 2021-09-10 DIAGNOSIS — S29012A Strain of muscle and tendon of back wall of thorax, initial encounter: Secondary | ICD-10-CM

## 2021-09-10 DIAGNOSIS — M546 Pain in thoracic spine: Secondary | ICD-10-CM

## 2021-09-10 DIAGNOSIS — M6281 Muscle weakness (generalized): Secondary | ICD-10-CM

## 2021-09-10 NOTE — Therapy (Addendum)
[place], [place], [address] Phone: [phone]   Fax:  [phone]  Physical Therapy Treatment/Discharge  Patient Details  Name: Timothy Bell MRN: [id number] Date of Birth: 06-02-1981 Referring Provider (PT): Odis Luster, MD   Encounter Date: 09/10/2021   PT End of Session - 09/10/21 0732     Visit Number 8    Number of Visits 13    Date for PT Re-Evaluation 09/06/21    Authorization Type [employer]    Progress Note Due on Visit 10    PT Start Time 0730    PT Stop Time 0800    PT Time Calculation (min) 30 min    Activity Tolerance Patient tolerated treatment well    Behavior During Therapy [facility] for tasks assessed/performed             Past Medical History:  Diagnosis Date   Hypertension     Past Surgical History:  Procedure Laterality Date   KNEE SURGERY     right knee athroscopy with release    There were no vitals filed for this visit.   Subjective Assessment - 09/10/21 0734     Subjective I've continued not to have pain. I'm doing all of my regular work duties except lifting heavy objects.    Currently in Pain? No/denies    Pain Onset --                                TREATMENT 08/20/2021:   Therapeutic Exercise: - UBE 5 mins L3 2 mins in each direction - shoulder row, BlueTB, x20 - shoulder ext, BlueTB, x20 - bilat scap retract / ER with Blue Tband x15    Therapeutic Activity: - Waist to overhead lifting 10lbs, x20, R and L - knee to chest lifting c turn, 40lbs, x10   Self care: Abdominal setting prior to lifting for back protection             PT Short Term Goals - 08/27/21 0727       PT SHORT TERM GOAL #1   Title Pt will be Ind in an initial HEP    Baseline started on eval; 08/27/21 independent with HEP    Status Achieved      PT SHORT TERM GOAL #2   Title Pt will voice understanding of measures to assist in the  reduction and management of pain.    Status Achieved    Target Date 08/09/21               PT Long Term Goals - 09/10/21 0739       PT LONG TERM GOAL #1   Title Pt will be Ind in a final HEP to maintain achieved LOF    Status Achieved    Target Date 09/05/21      PT LONG TERM GOAL #2   Title Pt will demonstrate 5/5 strength of the R UE and tolerate lfiting 40Lbs from knee to chest height with 1 lift. 07/11/22: R shoulder flexion and abd 5/5. Pt able to lift 40lbs    Baseline 08/27/21 able to lift knee to chest height 40#; 09/02/20: MMT 4+/5 for fleion and abduction    Status Achieved    Target Date 09/10/21      PT LONG TERM GOAL #3   Title Pt will tolerate lifting 10 lb 20x from waist level to shoulder height demonstrating appropriate tolerance  for repeated lifting    Status Achieved    Target Date 09/05/21      PT LONG TERM GOAL #4   Title Pt will reports improved mid back pain to 0-2 with daily and work related activities. 09/10/21    Baseline 5-8/10  08/27/21: no pain for the last week, 09/02/21: continues without pain for last 2 weeks.09/10/21: No pain for for 3 weeks    Status Achieved    Target Date 09/10/21                   Plan - 09/10/21 0736     Clinical Impression Statement Pt has progressed well re: R back/shoulder pain and function. Pt is completing work related activities without an issue. Pt's FOTO score = 94%. Pt is Ind with a HEP to continue strength and flexibility.    Examination-Activity Limitations Reach Overhead;Carry;Lift;Stand    Examination-Participation Restrictions Occupation    Stability/Clinical Decision Making Stable/Uncomplicated    Clinical Decision Making Low    Rehab Potential Good    PT Frequency 2x / week    PT Duration 6 weeks    PT Treatment/Interventions ADLs/Self Care Home Management;Cryotherapy;Electrical Stimulation;Iontophoresis 6m/ml Dexamethasone;Moist Heat;Neuromuscular re-education;Therapeutic exercise;Therapeutic  activities;Patient/family education;Manual techniques;Dry needling;Passive range of motion;Taping;Vasopneumatic Device;Joint Manipulations    PT Next Visit Plan Assess response to HEP, progress ther ex as indicated. Increase resistance and complete shoulder height to OH lifting as tolerated. progress lifting demand as tolerated.    PT Home Exercise Plan R cross arm c trunk rotation L stretch, R arm reach through while standing leaning forward on counter, shoulder row    Consulted and Agree with Plan of Care Patient             Patient will benefit from skilled therapeutic intervention in order to improve the following deficits and impairments:  Decreased activity tolerance, Obesity, Impaired UE functional use, Pain, Decreased strength  Visit Diagnosis: Strain of muscle and tendon of back wall of thorax, initial encounter - Plan: PT plan of care cert/re-cert  Muscle weakness (generalized) - Plan: PT plan of care cert/re-cert  Pain in thoracic spine - Plan: PT plan of care cert/re-cert     Problem List Patient Active Problem List   Diagnosis Date Noted   ESSENTIAL HYPERTENSION 01/07/2010   ORAL APHTHAE 01/07/2010   HSV 09/19/2009   EPSTEIN-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 Patient goals were met. Patient is being discharged due to being pleased with the current functional level.   AGar PontoMS, PT 09/10/21 1:29 PM   [facility] [address]Phone: [phone]  Fax:  [phone] Name: Timothy ThunderMRN: [id number] of Birth: [birth year]

## 2021-09-10 NOTE — Addendum Note (Signed)
Addended by: Gar Ponto on: 09/10/2021 01:30 PM   Modules accepted: Orders

## 2022-07-05 ENCOUNTER — Other Ambulatory Visit (HOSPITAL_BASED_OUTPATIENT_CLINIC_OR_DEPARTMENT_OTHER): Payer: Self-pay

## 2022-07-05 MED ORDER — INFLUENZA VAC SPLIT QUAD 0.5 ML IM SUSY
PREFILLED_SYRINGE | INTRAMUSCULAR | 0 refills | Status: AC
  Filled 2022-07-05: qty 0.5, 1d supply, fill #0

## 2022-10-13 ENCOUNTER — Encounter (HOSPITAL_BASED_OUTPATIENT_CLINIC_OR_DEPARTMENT_OTHER): Payer: Self-pay

## 2022-10-13 ENCOUNTER — Ambulatory Visit (HOSPITAL_BASED_OUTPATIENT_CLINIC_OR_DEPARTMENT_OTHER): Payer: Commercial Managed Care - PPO | Admitting: Orthopaedic Surgery

## 2022-11-03 ENCOUNTER — Ambulatory Visit (HOSPITAL_BASED_OUTPATIENT_CLINIC_OR_DEPARTMENT_OTHER): Payer: Commercial Managed Care - PPO | Admitting: Orthopaedic Surgery

## 2022-11-11 ENCOUNTER — Ambulatory Visit (INDEPENDENT_AMBULATORY_CARE_PROVIDER_SITE_OTHER): Payer: Commercial Managed Care - PPO

## 2022-11-11 ENCOUNTER — Ambulatory Visit (HOSPITAL_BASED_OUTPATIENT_CLINIC_OR_DEPARTMENT_OTHER): Payer: Commercial Managed Care - PPO | Admitting: Orthopaedic Surgery

## 2022-11-11 DIAGNOSIS — G5601 Carpal tunnel syndrome, right upper limb: Secondary | ICD-10-CM | POA: Diagnosis not present

## 2022-11-11 DIAGNOSIS — M79641 Pain in right hand: Secondary | ICD-10-CM | POA: Diagnosis not present

## 2022-11-11 DIAGNOSIS — M25541 Pain in joints of right hand: Secondary | ICD-10-CM | POA: Diagnosis not present

## 2022-11-11 NOTE — Progress Notes (Signed)
Chief Complaint: Right hand pain     History of Present Illness:    Kerrin Coit is a 42 y.o. male presents today with ongoing right index finger pain and spasming that has been ongoing for nearly a decade.  He states that this is been worse over the last several months.  He states that the right index finger gets locked into an extended position and started spasming at which point he is not able to make a complete composite fist.  He has been experiencing increased paresthesias into this index finger as well.  He is here today for further assessment.    Surgical History:   None  PMH/PSH/Family History/Social History/Meds/Allergies:    Past Medical History:  Diagnosis Date   Hypertension    Past Surgical History:  Procedure Laterality Date   KNEE SURGERY     right knee athroscopy with release   Social History   Socioeconomic History   Marital status: Single    Spouse name: Not on file   Number of children: Not on file   Years of education: Not on file   Highest education level: Not on file  Occupational History   Not on file  Tobacco Use   Smoking status: Never   Smokeless tobacco: Not on file  Substance and Sexual Activity   Alcohol use: Yes   Drug use: No   Sexual activity: Not on file  Other Topics Concern   Not on file  Social History Narrative   Not on file   Social Determinants of Health   Financial Resource Strain: Not on file  Food Insecurity: Not on file  Transportation Needs: Not on file  Physical Activity: Not on file  Stress: Not on file  Social Connections: Not on file   No family history on file. Allergies  Allergen Reactions   Amoxicillin    Amoxicillin-Pot Clavulanate    Cherry    Current Outpatient Medications  Medication Sig Dispense Refill   allopurinol (ZYLOPRIM) 100 MG tablet Take 100 mg by mouth daily.     chlorthalidone (HYGROTON) 25 MG tablet Take 25 mg by mouth daily.     clindamycin  (CLEOCIN) 300 MG capsule Take 1 capsule (300 mg total) by mouth 4 (four) times daily. X 7 days (Patient not taking: Reported on 07/19/2021) 28 capsule 0   Diclofenac Sodium CR 100 MG 24 hr tablet Take 100 mg by mouth daily.   (Patient not taking: Reported on 07/19/2021)     influenza vac split quadrivalent PF (FLUARIX) 0.5 ML injection Inject into the muscle. 0.5 mL 0   naproxen (NAPROSYN) 375 MG tablet Take 1 tablet (375 mg total) by mouth 2 (two) times daily. (Patient not taking: Reported on 07/19/2021) 20 tablet 0   olmesartan (BENICAR) 40 MG tablet Take 40 mg by mouth daily.     oxyCODONE-acetaminophen (PERCOCET) 5-325 MG per tablet Take 2 tablets by mouth every 4 (four) hours as needed. (Patient not taking: Reported on 07/19/2021) 20 tablet 0   predniSONE (DELTASONE) 20 MG tablet Take 3 tabs PO daily x 5 days. 15 tablet 0   No current facility-administered medications for this visit.   No results found.  Review of Systems:   A ROS was performed including pertinent positives and negatives as documented in the HPI.  Physical Exam :  Constitutional: NAD and appears stated age Neurological: Alert and oriented Psych: Appropriate affect and cooperative There were no vitals taken for this visit.   Comprehensive Musculoskeletal Exam:    Tenderness to palpation about the first lumbrical of the index finger on the right.  He is having somewhat spontaneous spasms of the index finger which is equal to the contralateral side.  He is able to make a composite fist without pain.  Positive Tinel sign about the carpal tunnel.  2+ radial pulse  Imaging:    I personally reviewed and interpreted the radiographs.   Assessment:   42 y.o. male with right index finger pain and spasm.  I have described that his spasms are somewhat consistent with neurogenic phenomenon and he is experiencing index finger paresthesias.  This effect I do believe he would benefit from an EMG nerve conduction test of the index  finger in order to rule out any type of underlying carpal tunnel syndrome.  I did describe that we may ultimately provide a cortisone injection pending the results of this but we will plan to start with referral to Dr. Ernestina Patches for a right hand carpal tunnel assessment with EMG and nerve conduction  Plan :    -Plan for referral to Dr. Ernestina Patches for an EMG nerve conduction study     I personally saw and evaluated the patient, and participated in the management and treatment plan.  Vanetta Mulders, MD Attending Physician, Orthopedic Surgery  This document was dictated using Dragon voice recognition software. A reasonable attempt at proof reading has been made to minimize errors.

## 2022-11-23 ENCOUNTER — Telehealth: Payer: Self-pay | Admitting: Physical Medicine and Rehabilitation

## 2022-11-23 NOTE — Telephone Encounter (Signed)
Patient returned call asked for a call back. The number to contact patient is [phone]

## 2022-11-23 NOTE — Telephone Encounter (Signed)
Spoke with patient and scheduled NCV for 12/03/22.

## 2022-12-03 ENCOUNTER — Ambulatory Visit: Payer: Commercial Managed Care - PPO | Admitting: Physical Medicine and Rehabilitation

## 2022-12-03 DIAGNOSIS — R202 Paresthesia of skin: Secondary | ICD-10-CM

## 2022-12-03 NOTE — Progress Notes (Signed)
Functional Pain Scale - descriptive words and definitions  Mild (2)   Noticeable when not distracted/no impact on ADL's/sleep only slightly affected and able to   use both passive and active distraction for comfort. Mild range order  Average Pain 2  Right handed  Rule out carpal tunnel. Numbness, tingling, weakness in rt hand. Pt has spasms in hand and pointer finger locks up. Opening jars makes it worse, increasing pain to 8-10

## 2022-12-06 NOTE — Procedures (Signed)
EMG & NCV Findings: Evaluation of the right median (across palm) sensory nerve showed prolonged distal peak latency (Wrist, 3.9 ms).  All remaining nerves (as indicated in the following tables) were within normal limits.    All examined muscles (as indicated in the following table) showed no evidence of electrical instability.    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   There is no significant electrodiagnostic evidence of any other focal nerve entrapment, brachial plexopathy or cervical radiculopathy  Recommendations: 1.  Follow-up with referring physician. 2.  Continue current management of symptoms.  Careful clinical correlation is paramount.  Doubtful that mild carpal tunnel syndrome or mild median neuropathy are the cause of all of his symptoms.  ___________________________ Naaman PlummerFred Sophia Sperry FAAPMR Board Certified, American Board of Physical Medicine and Rehabilitation    Nerve Conduction Studies Anti Sensory Summary Table   Stim Site NR Peak (ms) Norm Peak (ms) P-T Amp (V) Norm P-T Amp Site1 Site2 Delta-P (ms) Dist (cm) Vel (m/s) Norm Vel (m/s)  Right Median Acr Palm Anti Sensory (2nd Digit)  31.6C  Wrist    *3.9 <3.6 12.1 >10 Wrist Palm 1.9 0.0    Palm    2.0 <2.0 9.9         Right Radial Anti Sensory (Base 1st Digit)  31.6C  Wrist    2.7 <3.1 7.5  Wrist Base 1st Digit 2.7 0.0    Right Ulnar Anti Sensory (5th Digit)  31.9C  Wrist    3.1 <3.7 28.4 >15.0 Wrist 5th Digit 3.1 14.0 45 >38   Motor Summary Table   Stim Site NR Onset (ms) Norm Onset (ms) O-P Amp (mV) Norm O-P Amp Site1 Site2 Delta-0 (ms) Dist (cm) Vel (m/s) Norm Vel (m/s)  Right Median Motor (Abd Poll Brev)  31.8C  Wrist    3.8 <4.2 11.6 >5 Elbow Wrist 5.4 28.0 52 >50  Elbow    9.2  3.6         Right Ulnar Motor (Abd Dig Min)  31.8C  Wrist    3.1 <4.2 7.1 >3 B Elbow  Wrist 4.4 28.0 64 >53  B Elbow    7.5  5.1  A Elbow B Elbow 1.3 12.0 92 >53  A Elbow    8.8  5.8          EMG   Side Muscle Nerve Root Ins Act Fibs Psw Amp Dur Poly Recrt Int Dennie BiblePat Comment  Right Abd Poll Brev Median C8-T1 Nml Nml Nml Nml Nml 0 Nml Nml   Right 1stDorInt Ulnar C8-T1 Nml Nml Nml Nml Nml 0 Nml Nml   Right PronatorTeres Median C6-7 Nml Nml Nml Nml Nml 0 Nml Nml   Right Biceps Musculocut C5-6 Nml Nml Nml Nml Nml 0 Nml Nml   Right Deltoid Axillary C5-6 Nml Nml Nml Nml Nml 0 Nml Nml     Nerve Conduction Studies Anti Sensory Left/Right Comparison   Stim Site L Lat (ms) R Lat (ms) L-R Lat (ms) L Amp (V) R Amp (V) L-R Amp (%) Site1 Site2 L Vel (m/s) R Vel (m/s) L-R Vel (m/s)  Median Acr Palm Anti Sensory (2nd Digit)  31.6C  Wrist  *3.9   12.1  Wrist Palm     Palm  2.0   9.9        Radial Anti Sensory (Base 1st Digit)  31.6C  Wrist  2.7  7.5  Wrist Base 1st Digit     Ulnar Anti Sensory (5th Digit)  31.9C  Wrist  3.1   28.4  Wrist 5th Digit  45    Motor Left/Right Comparison   Stim Site L Lat (ms) R Lat (ms) L-R Lat (ms) L Amp (mV) R Amp (mV) L-R Amp (%) Site1 Site2 L Vel (m/s) R Vel (m/s) L-R Vel (m/s)  Median Motor (Abd Poll Brev)  31.8C  Wrist  3.8   11.6  Elbow Wrist  52   Elbow  9.2   3.6        Ulnar Motor (Abd Dig Min)  31.8C  Wrist  3.1   7.1  B Elbow Wrist  64   B Elbow  7.5   5.1  A Elbow B Elbow  92   A Elbow  8.8   5.8           Waveforms:

## 2022-12-13 NOTE — Progress Notes (Signed)
Timothy Bell - 42 y.o. male MRN [id number]  Date of birth: [birth year]  Office Visit Note: Visit Date: 12/03/2022 PCP: Mick Sell, MD Referred by: Huel Cote, MD  Subjective: Chief Complaint  Patient presents with   Right Hand - Pain, Numbness, Tingling   HPI: Timothy Bell is a 42 y.o. male who comes in today at the request of Dr. Huel Cote for evaluation and management of chronic, worsening and severe pain, numbness and tingling in the Right upper extremities.  Patient is Right hand dominant.  He reports many years of index finger pain with locking sensation and difficulty opening jars and manipulating objects.  The symptoms are essentially in the index finger only but he does report sort of generalized weakness of the right hand.  He does report new or worsening paresthesia and numbness.  No frank radicular symptoms or neck pain.  Denies any left-sided symptoms.  When he uses his hand his pain can go from an average of 2 out of 10 to 8 out of 10 when using the hand itself.  No prior electrodiagnostic studies.     ROS Otherwise per HPI.  Assessment & Plan: Visit Diagnoses:    ICD-10-CM   1. 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   There is no significant electrodiagnostic evidence of any other focal nerve entrapment, brachial plexopathy or cervical radiculopathy  Recommendations: 1.  Follow-up with referring physician. 2.  Continue current management of symptoms.  Careful clinical correlation is paramount.  Doubtful that mild carpal tunnel syndrome or mild median neuropathy are the cause of all of his symptoms.  Meds & Orders: No orders of the defined types were placed in this encounter.   Orders Placed This Encounter  Procedures   NCV with  EMG (electromyography)    Follow-up: Return for Huel Cote, MD.   Procedures: No procedures performed  EMG & NCV Findings: Evaluation of the right median (across palm) sensory nerve showed prolonged distal peak latency (Wrist, 3.9 ms).  All remaining nerves (as indicated in the following tables) were within normal limits.    All examined muscles (as indicated in the following table) showed no evidence of electrical instability.    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   There is no significant electrodiagnostic evidence of any other focal nerve entrapment, brachial plexopathy or cervical radiculopathy  Recommendations: 1.  Follow-up with referring physician. 2.  Continue current management of symptoms.  Careful clinical correlation is paramount.  Doubtful that mild carpal tunnel syndrome or mild median neuropathy are the cause of all of his symptoms.  ___________________________ Naaman Plummer FAAPMR Board Certified, American Board of Physical Medicine and Rehabilitation    Nerve Conduction Studies Anti Sensory Summary Table   Stim Site NR Peak (ms) Norm Peak (ms) P-T Amp (V) Norm P-T Amp Site1 Site2 Delta-P (ms) Dist (cm) Vel (m/s) Norm Vel (m/s)  Right Median Acr Palm Anti Sensory (2nd Digit)  31.6C  Wrist    *3.9 <3.6 12.1 >10 Wrist Palm 1.9 0.0    Palm    2.0 <2.0 9.9         Right Radial Anti Sensory (Base 1st Digit)  31.6C  Wrist    2.7 <3.1 7.5  Wrist Base 1st Digit 2.7 0.0    Right Ulnar Anti Sensory (5th Digit)  31.9C  Wrist    3.1 <3.7 28.4 >15.0 Wrist 5th Digit 3.1 14.0 45 >38   Motor Summary Table   Stim Site NR Onset (ms) Norm Onset (ms) O-P Amp (mV) Norm O-P Amp Site1 Site2 Delta-0 (ms) Dist (cm) Vel (m/s) Norm Vel (m/s)  Right Median Motor (Abd Poll Brev)  31.8C  Wrist    3.8 <4.2 11.6 >5 Elbow Wrist 5.4  28.0 52 >50  Elbow    9.2  3.6         Right Ulnar Motor (Abd Dig Min)  31.8C  Wrist    3.1 <4.2 7.1 >3 B Elbow Wrist 4.4 28.0 64 >53  B Elbow    7.5  5.1  A Elbow B Elbow 1.3 12.0 92 >53  A Elbow    8.8  5.8          EMG   Side Muscle Nerve Root Ins Act Fibs Psw Amp Dur Poly Recrt Int Dennie Bible Comment  Right Abd Poll Brev Median C8-T1 Nml Nml Nml Nml Nml 0 Nml Nml   Right 1stDorInt Ulnar C8-T1 Nml Nml Nml Nml Nml 0 Nml Nml   Right PronatorTeres Median C6-7 Nml Nml Nml Nml Nml 0 Nml Nml   Right Biceps Musculocut C5-6 Nml Nml Nml Nml Nml 0 Nml Nml   Right Deltoid Axillary C5-6 Nml Nml Nml Nml Nml 0 Nml Nml     Nerve Conduction Studies Anti Sensory Left/Right Comparison   Stim Site L Lat (ms) R Lat (ms) L-R Lat (ms) L Amp (V) R Amp (V) L-R Amp (%) Site1 Site2 L Vel (m/s) R Vel (m/s) L-R Vel (m/s)  Median Acr Palm Anti Sensory (2nd Digit)  31.6C  Wrist  *3.9   12.1  Wrist Palm     Palm  2.0   9.9        Radial Anti Sensory (Base 1st Digit)  31.6C  Wrist  2.7   7.5  Wrist Base 1st Digit     Ulnar Anti Sensory (5th Digit)  31.9C  Wrist  3.1   28.4  Wrist 5th Digit  45    Motor Left/Right Comparison   Stim Site L Lat (ms) R Lat (ms) L-R Lat (ms) L Amp (mV) R Amp (mV) L-R Amp (%) Site1 Site2 L Vel (m/s) R Vel (m/s) L-R Vel (m/s)  Median Motor (Abd Poll Brev)  31.8C  Wrist  3.8   11.6  Elbow Wrist  52   Elbow  9.2   3.6        Ulnar Motor (Abd Dig Min)  31.8C  Wrist  3.1   7.1  B Elbow Wrist  64   B Elbow  7.5   5.1  A Elbow B Elbow  92   A Elbow  8.8   5.8           Waveforms:             Clinical History: No specialty comments available.   He reports that he has never smoked. He does not have any smokeless tobacco history on file. No results for input(s): "HGBA1C", "LABURIC" in the last 8760 hours.  Objective:  VS:  HT:    WT:   BMI:     BP:   HR: bpm  TEMP: ( )  RESP:  Physical Exam Musculoskeletal:  General: No tenderness.     Comments: Inspection  reveals no atrophy of the bilateral APB or FDI or hand intrinsics. There is no swelling, color changes, allodynia or dystrophic changes. There is 5 out of 5 strength in the bilateral wrist extension, finger abduction and long finger flexion. There is intact sensation to light touch in all dermatomal and peripheral nerve distributions. There is a negative Froment's test bilaterally. There is a negative Tinel's test at the bilateral wrist and elbow. There is a negative Phalen's test bilaterally. There is a negative Hoffmann's test bilaterally.  Skin:    General: Skin is warm and dry.     Findings: No erythema or rash.  Neurological:     General: No focal deficit present.     Mental Status: He is alert and oriented to person, place, and time.     Sensory: No sensory deficit.     Motor: No weakness or abnormal muscle tone.     Coordination: Coordination normal.     Gait: Gait normal.  Psychiatric:        Mood and Affect: Mood normal.        Behavior: Behavior normal.        Thought Content: Thought content normal.     Ortho Exam  Imaging: No results found.  Past Medical/Family/Surgical/Social History: Medications & Allergies reviewed per EMR, new medications updated. 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 Past Surgical History:  Procedure Laterality Date   KNEE SURGERY     right knee athroscopy with release   Social History   Occupational History   Not on file  Tobacco Use   Smoking status: Never   Smokeless tobacco: Not on file  Substance and Sexual Activity   Alcohol use: Yes   Drug use: No   Sexual activity: Not on file

## 2023-01-13 ENCOUNTER — Ambulatory Visit (INDEPENDENT_AMBULATORY_CARE_PROVIDER_SITE_OTHER): Payer: Commercial Managed Care - PPO | Admitting: Orthopaedic Surgery

## 2023-01-13 DIAGNOSIS — M25541 Pain in joints of right hand: Secondary | ICD-10-CM | POA: Diagnosis not present

## 2023-01-13 NOTE — Addendum Note (Signed)
Addended by: Kerby Less A on: 01/13/2023 08:20 AM   Modules accepted: Orders

## 2023-01-13 NOTE — Progress Notes (Signed)
Chief Complaint: Right hand pain     History of Present Illness:   01/13/2023: Presents today for follow-up of his right hand.  He states that overall since he had his nerve conduction test he has been having less spasming and locking up.  Nerve conduction findings were overall mild.  Timothy Bell is a 42 y.o. male presents today with ongoing right index finger pain and spasming that has been ongoing for nearly a decade.  He states that this is been worse over the last several months.  He states that the right index finger gets locked into an extended position and started spasming at which point he is not able to make a complete composite fist.  He has been experiencing increased paresthesias into this index finger as well.  He is here today for further assessment.    Surgical History:   None  PMH/PSH/Family History/Social History/Meds/Allergies:    Past Medical History:  Diagnosis Date   Hypertension    Past Surgical History:  Procedure Laterality Date   KNEE SURGERY     right knee athroscopy with release   Social History   Socioeconomic History   Marital status: Single    Spouse name: Not on file   Number of children: Not on file   Years of education: Not on file   Highest education level: Not on file  Occupational History   Not on file  Tobacco Use   Smoking status: Never   Smokeless tobacco: Not on file  Substance and Sexual Activity   Alcohol use: Yes   Drug use: No   Sexual activity: Not on file  Other Topics Concern   Not on file  Social History Narrative   Not on file   Social Determinants of Health   Financial Resource Strain: Not on file  Food Insecurity: Not on file  Transportation Needs: Not on file  Physical Activity: Not on file  Stress: Not on file  Social Connections: Not on file   No family history on file. Allergies  Allergen Reactions   Amoxicillin    Amoxicillin-Pot Clavulanate    Cherry     Current Outpatient Medications  Medication Sig Dispense Refill   allopurinol (ZYLOPRIM) 100 MG tablet Take 100 mg by mouth daily.     influenza vac split quadrivalent PF (FLUARIX) 0.5 ML injection Inject into the muscle. 0.5 mL 0   olmesartan (BENICAR) 40 MG tablet Take 40 mg by mouth daily.     No current facility-administered medications for this visit.   No results found.  Review of Systems:   A ROS was performed including pertinent positives and negatives as documented in the HPI.  Physical Exam :   Constitutional: NAD and appears stated age Neurological: Alert and oriented Psych: Appropriate affect and cooperative There were no vitals taken for this visit.   Comprehensive Musculoskeletal Exam:    Tenderness to palpation about the first lumbrical of the index finger on the right.  He is having somewhat spontaneous spasms of the index finger which is equal to the contralateral side.  He is able to make a composite fist without pain.  Positive Tinel sign about the carpal tunnel.  2+ radial pulse  Imaging:    I personally reviewed and interpreted the radiographs.   Assessment:   42 y.o. male  with right index finger pain and spasm.  Overall this did improve after the EMG nerve conduction test which did show evidence of mild carpal tunnel syndrome.  Given the fact that he did have so much relief with this I do think some dry needling of the hand would help but not dramatically.  Will plan for an occupational therapy referral for this Plan :    -Plan for OT referral for dry needling right hand     I personally saw and evaluated the patient, and participated in the management and treatment plan.  Huel Cote, MD Attending Physician, Orthopedic Surgery  This document was dictated using Dragon voice recognition software. A reasonable attempt at proof reading has been made to minimize errors.

## 2023-06-30 ENCOUNTER — Other Ambulatory Visit (HOSPITAL_BASED_OUTPATIENT_CLINIC_OR_DEPARTMENT_OTHER): Payer: Self-pay

## 2023-06-30 MED ORDER — FLULAVAL 0.5 ML IM SUSY
0.5000 mL | PREFILLED_SYRINGE | Freq: Once | INTRAMUSCULAR | 0 refills | Status: AC
  Filled 2023-06-30: qty 0.5, 1d supply, fill #0

## 2023-11-24 DIAGNOSIS — I10 Essential (primary) hypertension: Secondary | ICD-10-CM | POA: Diagnosis not present

## 2023-12-06 ENCOUNTER — Ambulatory Visit

## 2023-12-06 ENCOUNTER — Other Ambulatory Visit: Payer: Self-pay | Admitting: Internal Medicine

## 2023-12-06 DIAGNOSIS — R1031 Right lower quadrant pain: Secondary | ICD-10-CM | POA: Diagnosis not present

## 2023-12-06 DIAGNOSIS — I10 Essential (primary) hypertension: Secondary | ICD-10-CM | POA: Diagnosis not present

## 2023-12-06 DIAGNOSIS — K760 Fatty (change of) liver, not elsewhere classified: Secondary | ICD-10-CM | POA: Diagnosis not present

## 2023-12-06 DIAGNOSIS — N281 Cyst of kidney, acquired: Secondary | ICD-10-CM | POA: Diagnosis not present

## 2023-12-06 MED ORDER — IOHEXOL 300 MG/ML  SOLN
200.0000 mL | Freq: Once | INTRAMUSCULAR | Status: AC | PRN
  Administered 2023-12-06: 125 mL via INTRAVENOUS
